# Patient Record
Sex: Male | Born: 1940 | Race: White | Hispanic: No | Marital: Married | Smoking: Current every day smoker
Health system: Southern US, Community
[De-identification: ages and names within clinical notes are randomized; demographics above are authoritative.]

## PROBLEM LIST (undated history)

## (undated) DIAGNOSIS — Z8719 Personal history of other diseases of the digestive system: Secondary | ICD-10-CM

## (undated) DIAGNOSIS — R51 Headache: Secondary | ICD-10-CM

## (undated) DIAGNOSIS — H9190 Unspecified hearing loss, unspecified ear: Secondary | ICD-10-CM

## (undated) DIAGNOSIS — I739 Peripheral vascular disease, unspecified: Secondary | ICD-10-CM

## (undated) DIAGNOSIS — I639 Cerebral infarction, unspecified: Secondary | ICD-10-CM

## (undated) DIAGNOSIS — G8929 Other chronic pain: Secondary | ICD-10-CM

## (undated) DIAGNOSIS — Z8711 Personal history of peptic ulcer disease: Secondary | ICD-10-CM

## (undated) DIAGNOSIS — I2510 Atherosclerotic heart disease of native coronary artery without angina pectoris: Secondary | ICD-10-CM

## (undated) DIAGNOSIS — R519 Headache, unspecified: Secondary | ICD-10-CM

## (undated) DIAGNOSIS — E785 Hyperlipidemia, unspecified: Secondary | ICD-10-CM

## (undated) DIAGNOSIS — S21339A Puncture wound without foreign body of unspecified front wall of thorax with penetration into thoracic cavity, initial encounter: Secondary | ICD-10-CM

## (undated) DIAGNOSIS — J4489 Other specified chronic obstructive pulmonary disease: Secondary | ICD-10-CM

## (undated) DIAGNOSIS — J189 Pneumonia, unspecified organism: Secondary | ICD-10-CM

## (undated) DIAGNOSIS — Z8709 Personal history of other diseases of the respiratory system: Secondary | ICD-10-CM

## (undated) DIAGNOSIS — C44300 Unspecified malignant neoplasm of skin of unspecified part of face: Secondary | ICD-10-CM

## (undated) DIAGNOSIS — W3400XA Accidental discharge from unspecified firearms or gun, initial encounter: Secondary | ICD-10-CM

## (undated) DIAGNOSIS — I10 Essential (primary) hypertension: Secondary | ICD-10-CM

## (undated) DIAGNOSIS — M1990 Unspecified osteoarthritis, unspecified site: Secondary | ICD-10-CM

## (undated) DIAGNOSIS — J449 Chronic obstructive pulmonary disease, unspecified: Secondary | ICD-10-CM

## (undated) DIAGNOSIS — M545 Low back pain, unspecified: Secondary | ICD-10-CM

## (undated) DIAGNOSIS — Z87442 Personal history of urinary calculi: Secondary | ICD-10-CM

## (undated) HISTORY — DX: Cerebral infarction, unspecified: I63.9

## (undated) HISTORY — DX: Headache, unspecified: R51.9

## (undated) HISTORY — DX: Headache: R51

## (undated) HISTORY — PX: KNEE ARTHROSCOPY: SHX127

## (undated) HISTORY — DX: Accidental discharge from unspecified firearms or gun, initial encounter: W34.00XA

## (undated) HISTORY — DX: Puncture wound without foreign body of unspecified front wall of thorax with penetration into thoracic cavity, initial encounter: S21.339A

## (undated) HISTORY — DX: Other chronic pain: G89.29

## (undated) HISTORY — PX: APPENDECTOMY: SHX54

## (undated) HISTORY — PX: SKIN CANCER EXCISION: SHX779

## (undated) HISTORY — DX: Unspecified hearing loss, unspecified ear: H91.90

## (undated) HISTORY — PX: SHOULDER OPEN ROTATOR CUFF REPAIR: SHX2407

## (undated) HISTORY — DX: Hyperlipidemia, unspecified: E78.5

## (undated) HISTORY — PX: PILONIDAL CYST / SINUS EXCISION: SUR543

## (undated) HISTORY — DX: Personal history of other diseases of the respiratory system: Z87.09

## (undated) HISTORY — PX: OTHER SURGICAL HISTORY: SHX169

---

## 1971-10-11 DIAGNOSIS — S21339A Puncture wound without foreign body of unspecified front wall of thorax with penetration into thoracic cavity, initial encounter: Secondary | ICD-10-CM

## 1971-10-11 HISTORY — DX: Puncture wound without foreign body of unspecified front wall of thorax with penetration into thoracic cavity, initial encounter: S21.339A

## 1999-03-17 ENCOUNTER — Other Ambulatory Visit: Admission: RE | Admit: 1999-03-17 | Discharge: 1999-03-17 | Payer: Self-pay | Admitting: Urology

## 2000-01-05 ENCOUNTER — Other Ambulatory Visit: Admission: RE | Admit: 2000-01-05 | Discharge: 2000-01-05 | Payer: Self-pay | Admitting: *Deleted

## 2000-02-02 ENCOUNTER — Encounter (INDEPENDENT_AMBULATORY_CARE_PROVIDER_SITE_OTHER): Payer: Self-pay | Admitting: *Deleted

## 2000-02-02 ENCOUNTER — Ambulatory Visit (HOSPITAL_BASED_OUTPATIENT_CLINIC_OR_DEPARTMENT_OTHER): Admission: RE | Admit: 2000-02-02 | Discharge: 2000-02-03 | Payer: Self-pay | Admitting: *Deleted

## 2001-02-27 ENCOUNTER — Ambulatory Visit (HOSPITAL_COMMUNITY): Admission: RE | Admit: 2001-02-27 | Discharge: 2001-02-27 | Payer: Self-pay | Admitting: *Deleted

## 2001-07-26 ENCOUNTER — Encounter: Payer: Self-pay | Admitting: Cardiovascular Disease

## 2001-07-26 ENCOUNTER — Ambulatory Visit (HOSPITAL_COMMUNITY): Admission: RE | Admit: 2001-07-26 | Discharge: 2001-07-27 | Payer: Self-pay | Admitting: Cardiovascular Disease

## 2001-09-13 ENCOUNTER — Encounter: Payer: Self-pay | Admitting: Emergency Medicine

## 2001-09-13 ENCOUNTER — Emergency Department (HOSPITAL_COMMUNITY): Admission: EM | Admit: 2001-09-13 | Discharge: 2001-09-13 | Payer: Self-pay | Admitting: Emergency Medicine

## 2001-11-28 ENCOUNTER — Encounter (INDEPENDENT_AMBULATORY_CARE_PROVIDER_SITE_OTHER): Payer: Self-pay | Admitting: Specialist

## 2001-11-28 ENCOUNTER — Ambulatory Visit (HOSPITAL_BASED_OUTPATIENT_CLINIC_OR_DEPARTMENT_OTHER): Admission: RE | Admit: 2001-11-28 | Discharge: 2001-11-28 | Payer: Self-pay | Admitting: Plastic Surgery

## 2002-08-28 ENCOUNTER — Inpatient Hospital Stay (HOSPITAL_COMMUNITY): Admission: EM | Admit: 2002-08-28 | Discharge: 2002-08-29 | Payer: Self-pay

## 2002-08-28 ENCOUNTER — Encounter: Payer: Self-pay | Admitting: Cardiovascular Disease

## 2002-09-30 ENCOUNTER — Emergency Department (HOSPITAL_COMMUNITY): Admission: EM | Admit: 2002-09-30 | Discharge: 2002-09-30 | Payer: Self-pay | Admitting: Emergency Medicine

## 2002-10-02 ENCOUNTER — Encounter: Payer: Self-pay | Admitting: Family Medicine

## 2002-10-02 ENCOUNTER — Encounter: Admission: RE | Admit: 2002-10-02 | Discharge: 2002-10-02 | Payer: Self-pay | Admitting: Family Medicine

## 2002-10-23 ENCOUNTER — Encounter: Admission: RE | Admit: 2002-10-23 | Discharge: 2002-10-23 | Payer: Self-pay | Admitting: *Deleted

## 2002-10-23 ENCOUNTER — Encounter: Payer: Self-pay | Admitting: *Deleted

## 2002-11-15 ENCOUNTER — Encounter: Admission: RE | Admit: 2002-11-15 | Discharge: 2002-11-15 | Payer: Self-pay | Admitting: *Deleted

## 2002-11-15 ENCOUNTER — Encounter: Payer: Self-pay | Admitting: *Deleted

## 2003-08-05 ENCOUNTER — Encounter (INDEPENDENT_AMBULATORY_CARE_PROVIDER_SITE_OTHER): Payer: Self-pay | Admitting: *Deleted

## 2003-08-05 ENCOUNTER — Ambulatory Visit (HOSPITAL_COMMUNITY): Admission: RE | Admit: 2003-08-05 | Discharge: 2003-08-05 | Payer: Self-pay | Admitting: Gastroenterology

## 2003-11-06 ENCOUNTER — Ambulatory Visit (HOSPITAL_COMMUNITY): Admission: RE | Admit: 2003-11-06 | Discharge: 2003-11-06 | Payer: Self-pay | Admitting: Gastroenterology

## 2003-11-06 ENCOUNTER — Encounter (INDEPENDENT_AMBULATORY_CARE_PROVIDER_SITE_OTHER): Payer: Self-pay | Admitting: *Deleted

## 2004-04-02 ENCOUNTER — Encounter: Admission: RE | Admit: 2004-04-02 | Discharge: 2004-04-02 | Payer: Self-pay | Admitting: Orthopedic Surgery
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
## 2004-04-05 ENCOUNTER — Ambulatory Visit (HOSPITAL_COMMUNITY): Admission: RE | Admit: 2004-04-05 | Discharge: 2004-04-05 | Payer: Self-pay | Admitting: Orthopedic Surgery

## 2004-04-05 ENCOUNTER — Ambulatory Visit (HOSPITAL_BASED_OUTPATIENT_CLINIC_OR_DEPARTMENT_OTHER): Admission: RE | Admit: 2004-04-05 | Discharge: 2004-04-05 | Payer: Self-pay | Admitting: Orthopedic Surgery

## 2004-11-29 ENCOUNTER — Ambulatory Visit (HOSPITAL_BASED_OUTPATIENT_CLINIC_OR_DEPARTMENT_OTHER): Admission: RE | Admit: 2004-11-29 | Discharge: 2004-11-29 | Payer: Self-pay | Admitting: Orthopedic Surgery

## 2005-02-14 ENCOUNTER — Encounter: Admission: RE | Admit: 2005-02-14 | Discharge: 2005-02-14 | Payer: Self-pay | Admitting: Cardiovascular Disease

## 2005-02-18 ENCOUNTER — Ambulatory Visit: Payer: Self-pay | Admitting: Pulmonary Disease

## 2005-02-21 ENCOUNTER — Ambulatory Visit (HOSPITAL_COMMUNITY): Admission: RE | Admit: 2005-02-21 | Discharge: 2005-02-22 | Payer: Self-pay | Admitting: Cardiovascular Disease

## 2005-03-04 ENCOUNTER — Ambulatory Visit: Payer: Self-pay | Admitting: Pulmonary Disease

## 2005-03-30 ENCOUNTER — Inpatient Hospital Stay (HOSPITAL_COMMUNITY): Admission: EM | Admit: 2005-03-30 | Discharge: 2005-04-02 | Payer: Self-pay | Admitting: Emergency Medicine

## 2005-08-03 ENCOUNTER — Inpatient Hospital Stay (HOSPITAL_COMMUNITY): Admission: EM | Admit: 2005-08-03 | Discharge: 2005-08-05 | Payer: Self-pay | Admitting: Emergency Medicine

## 2006-09-06 ENCOUNTER — Ambulatory Visit: Payer: Self-pay | Admitting: Pulmonary Disease

## 2006-09-29 ENCOUNTER — Ambulatory Visit: Payer: Self-pay | Admitting: Pulmonary Disease

## 2006-10-12 ENCOUNTER — Encounter: Admission: RE | Admit: 2006-10-12 | Discharge: 2006-10-12 | Payer: Self-pay | Admitting: Cardiovascular Disease

## 2006-10-19 ENCOUNTER — Ambulatory Visit (HOSPITAL_COMMUNITY): Admission: RE | Admit: 2006-10-19 | Discharge: 2006-10-19 | Payer: Self-pay | Admitting: Cardiovascular Disease

## 2007-01-09 ENCOUNTER — Ambulatory Visit: Payer: Self-pay | Admitting: Pulmonary Disease

## 2007-04-05 ENCOUNTER — Ambulatory Visit: Payer: Self-pay | Admitting: Pulmonary Disease

## 2007-05-29 ENCOUNTER — Ambulatory Visit: Payer: Self-pay | Admitting: Pulmonary Disease

## 2007-08-14 DIAGNOSIS — R51 Headache: Secondary | ICD-10-CM | POA: Insufficient documentation

## 2007-08-14 DIAGNOSIS — E785 Hyperlipidemia, unspecified: Secondary | ICD-10-CM | POA: Insufficient documentation

## 2007-08-14 DIAGNOSIS — R519 Headache, unspecified: Secondary | ICD-10-CM | POA: Insufficient documentation

## 2007-08-14 DIAGNOSIS — J329 Chronic sinusitis, unspecified: Secondary | ICD-10-CM | POA: Insufficient documentation

## 2007-08-14 DIAGNOSIS — J449 Chronic obstructive pulmonary disease, unspecified: Secondary | ICD-10-CM

## 2007-08-14 DIAGNOSIS — J209 Acute bronchitis, unspecified: Secondary | ICD-10-CM | POA: Insufficient documentation

## 2007-08-14 DIAGNOSIS — J309 Allergic rhinitis, unspecified: Secondary | ICD-10-CM | POA: Insufficient documentation

## 2007-08-14 DIAGNOSIS — H9190 Unspecified hearing loss, unspecified ear: Secondary | ICD-10-CM | POA: Insufficient documentation

## 2007-08-15 ENCOUNTER — Ambulatory Visit: Payer: Self-pay | Admitting: Internal Medicine

## 2007-09-12 ENCOUNTER — Ambulatory Visit: Payer: Self-pay | Admitting: Pulmonary Disease

## 2007-10-16 ENCOUNTER — Encounter: Payer: Self-pay | Admitting: Pulmonary Disease

## 2007-10-16 ENCOUNTER — Ambulatory Visit: Payer: Self-pay | Admitting: Internal Medicine

## 2007-10-16 DIAGNOSIS — B370 Candidal stomatitis: Secondary | ICD-10-CM

## 2007-12-03 ENCOUNTER — Ambulatory Visit (HOSPITAL_COMMUNITY): Admission: RE | Admit: 2007-12-03 | Discharge: 2007-12-03 | Payer: Self-pay | Admitting: Cardiovascular Disease

## 2008-01-11 ENCOUNTER — Ambulatory Visit: Payer: Self-pay | Admitting: Pulmonary Disease

## 2008-07-11 ENCOUNTER — Ambulatory Visit: Payer: Self-pay | Admitting: Pulmonary Disease

## 2009-03-13 ENCOUNTER — Ambulatory Visit: Payer: Self-pay | Admitting: Pulmonary Disease

## 2009-06-16 ENCOUNTER — Encounter: Admission: RE | Admit: 2009-06-16 | Discharge: 2009-06-16 | Payer: Self-pay | Admitting: Cardiovascular Disease

## 2009-06-23 ENCOUNTER — Inpatient Hospital Stay (HOSPITAL_COMMUNITY): Admission: AD | Admit: 2009-06-23 | Discharge: 2009-06-24 | Payer: Self-pay | Admitting: *Deleted

## 2009-10-12 ENCOUNTER — Telehealth (INDEPENDENT_AMBULATORY_CARE_PROVIDER_SITE_OTHER): Payer: Self-pay | Admitting: *Deleted

## 2009-11-13 ENCOUNTER — Ambulatory Visit: Payer: Self-pay | Admitting: Pulmonary Disease

## 2009-12-11 ENCOUNTER — Telehealth (INDEPENDENT_AMBULATORY_CARE_PROVIDER_SITE_OTHER): Payer: Self-pay | Admitting: *Deleted

## 2010-05-19 ENCOUNTER — Ambulatory Visit: Payer: Self-pay | Admitting: Pulmonary Disease

## 2010-05-19 DIAGNOSIS — R05 Cough: Secondary | ICD-10-CM

## 2010-10-13 ENCOUNTER — Encounter
Admission: RE | Admit: 2010-10-13 | Discharge: 2010-10-13 | Payer: Self-pay | Source: Home / Self Care | Attending: Neurosurgery | Admitting: Neurosurgery

## 2010-11-09 NOTE — Assessment & Plan Note (Signed)
Summary: acute sick visit for emphysema   Primary Provider/Referring Provider:  Vira Browns [facility])  CC:  Pt is here for a 6 month f/u appt on his emphysema.  Pt c/o increased sob with exertion and at rest.   Pt c/o mostly non-productive cough but will occ cough up yellow sputum.  Pt  states he wakes up in the mornings and it feels like he has an "elephant on his chest"   Currently smokes 1 1/2 ppd. .  History of Present Illness: the pt comes in today for his 6mos f/u, but is having ongoing issues that need to be addressed.  He has known emphysema and cardiac disease, and is c/o increased dry hacking cough as well as chest tightness with diaphoresis in the early am.  He has not taken a ntg to see if it would go away, nor has he used his rescue inhaler to see if it would help.  He does think that his breathing is not as good as earlier in the year, but wonders if it is due to the heat and humidity.  Unfortunately, he continues to smoke nearly 2 ppd.  Preventive Screening-Counseling & Management  Alcohol-Tobacco     Smoking Status: current     Smoking Cessation Counseling: yes     Packs/Day: 1.5     Tobacco Counseling: to quit use of tobacco products  Current Medications (verified): 1)  Plavix 75 Mg  Tabs (Clopidogrel Bisulfate) .... Take One Tab By Mouth Once Daily 2)  Spiriva Handihaler 18 Mcg  Caps (Tiotropium Bromide Monohydrate) .... Inhale Contents of 1 Capsule Once A Day 3)  Pantoprazole Sodium 40 Mg Tbec (Pantoprazole Sodium) .... Once Daily 4)  Symbicort 160-4.5 Mcg/act  Aero (Budesonide-Formoterol Fumarate) .... 2 Puffs Two Times A Day 5)  Proair Hfa 108 (90 Base) Mcg/act  Aers (Albuterol Sulfate) .Marland Kitchen.. 1-2 Puffs Every 4-6 Hours As Needed 6)  Metoprolol Succinate 50 Mg  Tb24 (Metoprolol Succinate) .... Take 1/2 Tabs Two Times A Day 7)  Lovaza 1 Gm  Caps (Omega-3-Acid Ethyl Esters) .... Take 2 Tabs By Mouth Daily 8)  Lisinopril 40 Mg Tabs (Lisinopril) .... Take 1 Tablet By Mouth  Once A Day 9)  Simvastatin 40 Mg Tabs (Simvastatin) .... Take 1 Tablet By Mouth Once A Day 10)  Metformin Hcl 500 Mg Tabs (Metformin Hcl) .... Take 1 Tablet By Mouth Once A Day  Allergies (verified): 1)  ! Niacin  Past History:  Past medical, surgical, family and social histories (including risk factors) reviewed, and no changes noted (except as noted below).  Past Medical History: Reviewed history from 10/16/2007 and no changes required. EMPHYSEMA (ICD-492.8) Hx of BRONCHITIS, ACUTE WITH BRONCHOSPASM (ICD-466.0) Hx of ALLERGIC RHINITIS (ICD-477.9) Hx of SINUSITIS (ICD-473.9) DECREASED HEARING (ICD-389.9) HEADACHE, CHRONIC (ICD-784.0) DYSLIPIDEMIA (ICD-272.4)  Family History: Reviewed history and no changes required.  Social History: Reviewed history from 03/13/2009 and no changes required. Patient is a current smoker.  1 1/2 ppd.  Packs/Day:  1.5  Review of Systems       The patient complains of shortness of breath with activity, shortness of breath at rest, productive cough, non-productive cough, headaches, and nasal congestion/difficulty breathing through nose.  The patient denies coughing up blood, chest pain, irregular heartbeats, acid heartburn, indigestion, loss of appetite, weight change, abdominal pain, difficulty swallowing, sore throat, tooth/dental problems, sneezing, itching, ear ache, anxiety, depression, hand/feet swelling, joint stiffness or pain, rash, change in color of mucus, and fever.    Vital Signs:  Patient profile:   70 year old male Height:      71.5 inches Weight:      255.38 pounds BMI:     35.25 O2 Sat:      97 % on Room air Temp:     97.5 degrees F oral Pulse rate:   90 / minute BP sitting:   160 / 84  (right arm) Cuff size:   large  Vitals Entered By: Arman Filter LPN (May 19, 2010 9:30 AM)  O2 Flow:  Room air CC: Pt is here for a 6 month f/u appt on his emphysema.  Pt c/o increased sob with exertion and at rest.   Pt c/o mostly  non-productive cough but will occ cough up yellow sputum.  Pt  states he wakes up in the mornings and it feels like he has an "elephant on his chest"   Currently smokes 1 1/2 ppd.  Comments Medications reviewed with patient Arman Filter LPN  May 19, 2010 9:30 AM    Physical Exam  General:  ow male in nad Nose:  no drainage or discharge Lungs:  decreased bs throughout, no wheezing or rhonchi Heart:  rrr Extremities:  minimal edema, but no cyanosis Neurologic:  alert and oriented, moves all 4.   Impression & Recommendations:  Problem # 1:  EMPHYSEMA (ICD-492.8) the pt has known emphysema and is on an aggressive bronchodilator regimen.  However, I have explained to him there are no medications which are going to offset the ongoing damage he is doing with continuing to smoke excessively.  There is nothing to suggest an acute exacerbation or pulmonary infection at this time, but it is unclear if his early am chest tightness is due to increased bronchial tone/airtrapping vs. angina.  I have asked him to take a ntg if this occurs again to see if it will resolve.  If does not, he can try his rescue inhaler.  The key for him will be smoking cessation.  Problem # 2:  COUGH (ICD-786.2) the pt has a persistent cough that is primarily dry.  He has some suggestion of postnasal drip, but also is on an ACE inhibitor.  I have asked him to try otc antihistamine to see if will help, but would also recommend a trial of his ACE to see if things improve.  Will leave that to his primary md.  Medications Added to Medication List This Visit: 1)  Lisinopril 40 Mg Tabs (Lisinopril) .... Take 1 tablet by mouth once a day 2)  Simvastatin 40 Mg Tabs (Simvastatin) .... Take 1 tablet by mouth once a day 3)  Metformin Hcl 500 Mg Tabs (Metformin hcl) .... Take 1 tablet by mouth once a day  Other Orders: Est. Patient Level IV (69629) Tobacco use cessation intermediate 3-10 minutes (52841)  Patient  Instructions: 1)  stop smoking 2)  will send a note to your primary doctor about getting you off lisinopril to see if your cough will improve. 3)  can try chlorpheniramine 8mg  at bedtime in the place of allergra for postnasal drip. 4)  continue on current inhaler regimen 5)  work on some type of exercise program. 6)  if you awaken early am with chest tightness and sweating, please take a nitroglycerin to see if goes away.  I will send a note to your primary md about this. 7)  followup with me in 6mos.

## 2010-11-09 NOTE — Assessment & Plan Note (Signed)
Summary: rov for emphysema   CC:  Follow up for refills.  states breathing is the same-no better and no worse. Pt c/o "tickle in throat" that causes him to cough during the night x83mo.  Requesting rxs for spiriva and symbicort.  Marland Kitchen  History of Present Illness: The pt comes in today for f/u of his known emphysema.  He is maintaining on symbicort and spiriva, and feels that his breathing is stable.  He has had no acute exacerbation nor recent pulmonary infection.  He has had a cough the last 3 mos which is primarily at night which is dry in nature, and from a tickle in his throat.  He has been rinsing well after using his inhalers.  Unfortunately, he is still smoking.  Current Medications (verified): 1)  Allegra 180 Mg  Tabs (Fexofenadine Hcl) .... Take Once Daily As Needed 2)  Plavix 75 Mg  Tabs (Clopidogrel Bisulfate) .... Take One Tab By Mouth Once Daily 3)  Spiriva Handihaler 18 Mcg  Caps (Tiotropium Bromide Monohydrate) .... Inhale Contents of 1 Capsule Once A Day 4)  Pantoprazole Sodium 40 Mg Tbec (Pantoprazole Sodium) .... Once Daily 5)  Symbicort 160-4.5 Mcg/act  Aero (Budesonide-Formoterol Fumarate) .... 2 Puffs Two Times A Day 6)  Proair Hfa 108 (90 Base) Mcg/act  Aers (Albuterol Sulfate) .Marland Kitchen.. 1-2 Puffs Every 4-6 Hours As Needed 7)  Metoprolol Succinate 50 Mg  Tb24 (Metoprolol Succinate) .... Take 1/2 Tabs Two Times A Day 8)  Lovaza 1 Gm  Caps (Omega-3-Acid Ethyl Esters) .... Take 2 Tabs By Mouth Daily  Allergies (verified): 1)  ! Niacin  Review of Systems      See HPI  Vital Signs:  Patient profile:   70 year old male Height:      71.5 inches Weight:      249.25 pounds BMI:     34.40 O2 Sat:      96 % on Room air Temp:     97.9 degrees F oral Pulse rate:   66 / minute BP sitting:   136 / 74  (left arm) Cuff size:   large  Vitals Entered By: Gweneth Dimitri RN (November 13, 2009 10:54 AM)  O2 Flow:  Room air CC: Follow up for refills.  states breathing is the same-no  better, no worse. Pt c/o "tickle in throat" that causes him to cough during the night x92mo.  Requesting rxs for spiriva and symbicort.   Comments Medications reviewed with patient Daytime contact number verified with patient. Gweneth Dimitri RN  November 13, 2009 10:54 AM     Physical Exam  General:  ow male in nad Lungs:  decreased bs throughout, no wheezing or rhonchi very mild bibasilar crackles. Heart:  rrr Extremities:  no significant edema, no cyanosis Neurologic:  alert and oriented, moves all 4.   Impression & Recommendations:  Problem # 1:  EMPHYSEMA (ICD-492.8) The pt is maintaining a reasonable baseline, but I think he could do so much better if he could quit smoking and work on conditioning/weight loss.  I have told him the first treatment of a cough in smokers is smoking cessation.  He is to stay on the same medications, and let me know if his cough continues to be a problem.  I would check a cxr if the cough continues.  Medications Added to Medication List This Visit: 1)  Pantoprazole Sodium 40 Mg Tbec (Pantoprazole sodium) .... Once daily  Other Orders: Est. Patient Level II (17616)  Patient Instructions: 1)  no change in meds 2)  work on quitting smoking 3)  followup with me in 6mos  Prescriptions: PROAIR HFA 108 (90 BASE) MCG/ACT  AERS (ALBUTEROL SULFATE) 1-2 puffs every 4-6 hours as needed  #1 x 6   Entered and Authorized by:   Barbaraann Share MD   Signed by:   Barbaraann Share MD on 11/13/2009   Method used:   Print then Give to Patient   RxID:   [id number] SYMBICORT 160-4.5 MCG/ACT  AERO (BUDESONIDE-FORMOTEROL FUMARATE) 2 puffs two times a day  #1 x 6   Entered and Authorized by:   Barbaraann Share MD   Signed by:   Barbaraann Share MD on 11/13/2009   Method used:   Print then Give to Patient   RxID:   [id number] SPIRIVA HANDIHALER 18 MCG  CAPS (TIOTROPIUM BROMIDE MONOHYDRATE) Inhale contents of 1 capsule once a day  #30 Each x 6   Entered and  Authorized by:   Barbaraann Share MD   Signed by:   Barbaraann Share MD on 11/13/2009   Method used:   Print then Give to Patient   RxID:   [id number]    Immunization History:  Influenza Immunization History:    Influenza:  historical (07/10/2009)   Appended Document: rov for emphysema megan, please call this pt and remind him to call us if his cough continues, and we will need to check a cxr if he has not had one recently.  thanks.  Appended Document: rov for emphysema called and spoke with pt.  pt states his last cxr was Sept 2010.  Also informed pt to call us if cough continues and/or doesn't improve.  pt verbalized understanding.

## 2010-11-09 NOTE — Progress Notes (Signed)
Summary: prescript  Phone Note Call from Patient   Caller: Patient Call For: clance Summary of Call: pt want to know why spiriva inhaler prescript was denied? Initial call taken by: Rickard Patience,  October 12, 2009 11:17 AM  Follow-up for Phone Call        rx refilled spoke with pt scheduled appt 11/13/09 pt unable to pay copay until then, understands rx will not be refilled if February appt is not kept. Follow-up by: Jerolyn Shin,  October 12, 2009 12:13 PM    Prescriptions: SPIRIVA HANDIHALER 18 MCG  CAPS (TIOTROPIUM BROMIDE MONOHYDRATE) Inhale contents of 1 capsule once a day  #30 Each x 0   Entered by:   Jerolyn Shin   Authorized by:   Barbaraann Share MD   Signed by:   Jerolyn Shin on 10/12/2009   Method used:   Electronically to        [facility] (retail)       [address]       [place], [place]  [address]       Ph: [phone]       Fax: [phone]   RxID:   [id number]

## 2010-11-09 NOTE — Progress Notes (Signed)
Summary: prescript  Phone Note Call from Patient   Caller: Spouse linda Call For: clance Summary of Call: need refill for spiriva [facility] [place] Initial call taken by: Rickard Patience,  December 11, 2009 8:34 AM  Follow-up for Phone Call        called, spoke with pt's wife Bonita Quin.  Informed her pt was given a rx for spirva at last ov on 11/13/09 with [facility].  Per Bonita Quin, [facility] but when he went to pick up rx he was told by [facility] that they did not have the rx.  [facility]ed[facility].  Spoke with [employer].  Per Ashlyn, pt last filled Spiriva on Feb 2 and had no more rx left and they do not have another rx for Spiriva.  Will resend rx electronically to [facility].  Pt's wife Bonita Quin aware.    Prescriptions: SPIRIVA HANDIHALER 18 MCG  CAPS (TIOTROPIUM BROMIDE MONOHYDRATE) Inhale contents of 1 capsule once a day  #30 Each x 6   Entered by:   Gweneth Dimitri RN   Authorized by:   Barbaraann Share MD   Signed by:   Gweneth Dimitri RN on 12/11/2009   Method used:   Electronically to        [facility] (retail)       [address]       [place], [place]  [address]       Ph: [phone]       Fax: [phone]   RxID:   [id number]

## 2010-11-15 ENCOUNTER — Ambulatory Visit (INDEPENDENT_AMBULATORY_CARE_PROVIDER_SITE_OTHER): Payer: Self-pay | Admitting: Pulmonary Disease

## 2010-11-15 ENCOUNTER — Encounter: Payer: Self-pay | Admitting: Pulmonary Disease

## 2010-11-15 DIAGNOSIS — J438 Other emphysema: Secondary | ICD-10-CM

## 2010-12-01 NOTE — Assessment & Plan Note (Signed)
Summary: rov for emphysema   Primary Provider/Referring Provider:  Vira Browns [facility])  CC:  6 month f/u appt on Emphysema.  Pt states his wife recently passed away.  Pt is currently smoking 1 1/2 ppd.  Pt states he has had difficulty breathing over the past several months and has been treated by pcp and ER with prednisone and abx.  Today pt c/o increased sob with exertion and coughing up gray sputum. Marland Kitchen  History of Present Illness: the pt comes in today for f/u of his known emphysema.  He has had worsening sob over the last few months, and has required abx and steroid tapers for what sounds like episodes of acute AB.  He has been smoking more than usual since the death of his wife, and this is most likely the cause.  He currently is not congested, and is not bringing up purulent mucus.  He is still a little more sob than usual.  Preventive Screening-Counseling & Management  Alcohol-Tobacco     Smoking Status: current     Smoking Cessation Counseling: yes     Packs/Day: 1.5     Tobacco Counseling: to quit use of tobacco products  Current Medications (verified): 1)  Plavix 75 Mg  Tabs (Clopidogrel Bisulfate) .... Take One Tab By Mouth Once Daily 2)  Spiriva Handihaler 18 Mcg  Caps (Tiotropium Bromide Monohydrate) .... Inhale Contents of 1 Capsule Once A Day 3)  Pantoprazole Sodium 40 Mg Tbec (Pantoprazole Sodium) .... Once Daily 4)  Symbicort 160-4.5 Mcg/act  Aero (Budesonide-Formoterol Fumarate) .... 2 Puffs Two Times A Day 5)  Proair Hfa 108 (90 Base) Mcg/act  Aers (Albuterol Sulfate) .Marland Kitchen.. 1-2 Puffs Every 4-6 Hours As Needed 6)  Metoprolol Succinate 50 Mg  Tb24 (Metoprolol Succinate) .... Take 1/2 Tabs Two Times A Day 7)  Lovaza 1 Gm  Caps (Omega-3-Acid Ethyl Esters) .... Take 2 Tabs By Mouth Daily 8)  Simvastatin 40 Mg Tabs (Simvastatin) .... Take 1 Tablet By Mouth Once A Day 9)  Metformin Hcl 500 Mg Tabs (Metformin Hcl) .... Take 1 Tablet By Mouth Once A Day  Allergies  (verified): 1)  ! Niacin  Social History: Patient is a current smoker.  1 1/2 ppd.  pt is widowed.  Review of Systems       The patient complains of shortness of breath with activity, productive cough, irregular heartbeats, loss of appetite, weight change, difficulty swallowing, nasal congestion/difficulty breathing through nose, sneezing, itching, anxiety, depression, and joint stiffness or pain.  The patient denies shortness of breath at rest, non-productive cough, coughing up blood, chest pain, acid heartburn, indigestion, abdominal pain, sore throat, tooth/dental problems, headaches, ear ache, hand/feet swelling, rash, change in color of mucus, and fever.    Vital Signs:  Patient profile:   70 year old male Height:      71.5 inches Weight:      258.38 pounds BMI:     35.66 O2 Sat:      96 % on Room air Temp:     97.8 degrees F oral Pulse rate:   65 / minute BP sitting:   136 / 62  (left arm) Cuff size:   large  Vitals Entered By: Arman Filter LPN (November 15, 2010 9:46 AM)  O2 Flow:  Room air CC: 6 month f/u appt on Emphysema.  Pt states his wife recently passed away.  Pt is currently smoking 1 1/2 ppd.  Pt states he has had difficulty breathing over the past several  months and has been treated by pcp and ER with prednisone and abx.  Today pt c/o increased sob with exertion and coughing up gray sputum.  Comments Medications reviewed with patient Arman Filter LPN  November 15, 2010 9:46 AM    Physical Exam  General:  ow male in nad Nose:  no purulence or discharge noted. Lungs:  decreased bs, a few rhonchi, no wheezing Heart:  rrr Extremities:  no edema or cyanosis  Neurologic:  alert and oriented, moves all 4.   Impression & Recommendations:  Problem # 1:  EMPHYSEMA (ICD-492.8) the pt has known emphysema, and most recently has had a few acute exacerbations requiring prednisone.  His wife recently died, and he has been smoking more than usual due to stress.  He has  rhonchi today, but no wheezing.  Will continue on his same bronchodilator regimen, but will also get him a neb machine to use for emergencies.  He understands there are no meds to keep him well if he continues to smoke.  Medications Added to Medication List This Visit: 1)  Albuterol Sulfate (2.5 Mg/73ml) 0.083% Nebu (Albuterol sulfate) .Marland Kitchen.. 1 vial in nebulizer every 6 hours as needed only  Other Orders: Est. Patient Level III ([address]) DME Referral (DME) Tobacco use cessation intermediate 3-10 minutes (91478)  Patient Instructions: 1)  stay on symbicort and spiriva 2)  will get you a neb machine with albuterol to use only for emergencies or when you are having a bad day.  Can use every 6 hrs if needed. 3)  stop smoking.  this is the only way you can stay well. 4)  followup with me in 6mos.   Prescriptions: ALBUTEROL SULFATE (2.5 MG/3ML) 0.083% NEBU (ALBUTEROL SULFATE) 1 vial in nebulizer every 6 hours as needed only  #120 x 6   Entered by:   Arman Filter LPN   Authorized by:   Barbaraann Share MD   Signed by:   Arman Filter LPN on 29/56/2130   Method used:   Printed then faxed to ...       [facility] [address] (retail)       [address]       [place], [place]  [address]       Ph: [phone]       Fax: [phone]   RxID:   [id number]    Immunization History:  Influenza Immunization History:    Influenza:  historical (06/10/2010)

## 2011-01-11 ENCOUNTER — Other Ambulatory Visit: Payer: Self-pay | Admitting: Pulmonary Disease

## 2011-01-14 LAB — GLUCOSE, CAPILLARY: Glucose-Capillary: 112 mg/dL — ABNORMAL HIGH (ref 70–99)

## 2011-01-14 LAB — CBC
HCT: 41.8 % (ref 39.0–52.0)
Hemoglobin: 14.4 g/dL (ref 13.0–17.0)
MCHC: 34.4 g/dL (ref 30.0–36.0)
MCV: 96.5 fL (ref 78.0–100.0)
Platelets: 197 10*3/uL (ref 150–400)
RBC: 4.33 MIL/uL (ref 4.22–5.81)
WBC: 6.4 10*3/uL (ref 4.0–10.5)

## 2011-01-14 LAB — BASIC METABOLIC PANEL
Chloride: 107 mEq/L (ref 96–112)
Creatinine, Ser: 0.98 mg/dL (ref 0.4–1.5)
GFR calc Af Amer: >60 mL/min (ref >60)
Glucose, Bld: 120 mg/dL — ABNORMAL HIGH (ref 70–99)

## 2011-02-22 NOTE — Cardiovascular Report (Signed)
NAME:  AVENIR, LOZINSKI NO.:  [id number]   MEDICAL RECORD NO.:  [id number]          PATIENT TYPE:  AMB   LOCATION:  SDS                          FACILITY:  [facility]   PHYSICIAN:  Nanetta Batty, M.D.   DATE OF BIRTH:  07-22-1941   DATE OF PROCEDURE:  DATE OF DISCHARGE:                            CARDIAC CATHETERIZATION   ABDOMINAL AORTOGRAM/PT AND STENT PROCEDURE   Mr. Worley is a 70 year old mildly overweight white male, a history of CAD  and PVOD.  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  His other problems include  hypertension, hyperlipidemia, COPD with continued tobacco abuse, GERD  and erectile dysfunction.  He is a small abdominal aortic aneurysm  measuring 3 x 3 cm.  I stented his left common iliac and restented him  October 19, 2006 for in-stent restenosis.  He has had recurrent  claudication with Doppler suggesting high-frequency signal in his left  common iliac.  Presents now for angiography and potential intervention.   DESCRIPTION OF PROCEDURE:  The patient brought to the second floor [facility]  of PV angiographic suite, in the postabsorptive state.  He was not  premedicated with p.o. Valium.  His left groin was prepped and shaved in  the usual sterile fashion.  One percent Xylocaine was used for local  anesthesia.  A 5-French sheath was inserted into the left femoral  artery, using standard Seldinger technique..  A 5-French short tennis-  racket catheter was used for distal abdominal aortography.  Visipaque  dye was used entirety of the case.  Retrograde aortic pressures were  monitored during the case.   ANGIOGRAPHIC RESULTS:  1. Abdominal aorta.      a.     Small distal abdominal aortic aneurysm.  2. Left lower extremity;      a.     A 50% to 60% in-stent restenosis within the proximal left       common iliac artery stent with 50-mm pullback gradient noted after       administration of 200 mcg of  intra-arterial nitroglycerin with       venous side-arm sheath.  3. Right lower extremity.      a.     Right iliac widely patent.   DESCRIPTION OF PROCEDURE:  The patient received 2500 units of heparin  intravenously.  This short 5-French sheath was exchanged over 0.035  Wholey wire for a 30 cm along  6-French Cordis sheath.  PTA was  performed with a 9.2 Powerflex nominal pressures which resulted in an  acceptable angiographic result but with residual pullback gradient.  Because of this, this was re-stented with a 10 x 3 Protege E3 nitinol  self-expanding stent and postdilated with a 9.2 Powerflex, resulting in  reduction of 60% in-stent restenosis to 0% residual.  The patient  tolerated the procedure well.  ACT was measured at less than 200.  Sheath was removed, and pressure was held to  the groin to achieve hemostasis.  The patient left the lab in stable  condition.  He  will remain recumbent for 6 hours, afterwards should be  discharged home as an outpatient.  He will have follow-up Dopplers and  ABIs and will see me back in the office for followup.      Nanetta Batty, M.D.  Electronically Signed     JB/MEDQ  D:  12/03/2007  T:  12/03/2007  Job:  [id number]   cc:   Patient Chart  2nd Floor [facility] Angiographic Ste  [facility]  [place], [place] Vira Browns, M.D. [facility]

## 2011-02-22 NOTE — Assessment & Plan Note (Signed)
[facility]                             PULMONARY OFFICE NOTE   NAME:[name], [name] COLLERAN                           MRN:          [id number]  DATE:08/15/2007                            DOB:          [birth year]    HISTORY:  This is a 70 year old white male active smoker who is  characterized as refractory chronic asthmatic bronchitis based on active  smoking who comes in today with acute-on-chronic complaints of cough  productive of yellow sputum, chest tightness, subjective wheezing on a  regimen that consists already of Symbicort 80/4.5 2 puffs b.i.d. and  Spiriva daily.  He has only minimum discoloration of sputum with no  history of pleuritic or exertional chest pain, orthopnea, PND, or leg  swelling.   PHYSICAL EXAMINATION:  GENERAL:  He is a pleasant ambulatory obese white  male in no acute distress.  VITAL SIGNS:  He has stable vital signs.  HEENT:  He has no upper teeth.  Lower dentition is intact.  Oropharynx  is clear.  NECK:  Supple without cervical adenopathy or tenderness.  Trachea is  midline, no thyromegaly.  LUNGS:  Lung fields reveal a few rhonchi bilaterally with end-expiratory  wheeze/cough.  CARDIAC:  Regular rate and rhythm without murmurs, rubs, or gallops.  ABDOMEN:  Soft, benign.  EXTREMITIES:  Warm without calf tenderness, cyanosis, or clubbing.   Chest x-ray was recommended.   MDI technique was reviewed and 25% at baseline and 75% with coaching.   IMPRESSION:  Acute-on-chronic cough, certainly consistent with chronic  asthmatic bronchitis related to continued smoking against medical  advice.  I recommended an increased dose of Symbicort to 160/4.5 two  puffs b.i.d. and made sure that he could inhale it effectively.  However, I emphasized to him, as Dr. Shelle Iron has in the past, that this  is not an antidote to smoking and that without smoking cessation it is  unlikely that he his going to see significant benefit  symptomatically.   I did review with him a reflux regimen, noting that he is already taking  Nexium but has very little insight into diet or pathophysiology.  I have  asked him to stop fish oil and all menthol products and return to see  Dr. Shelle Iron within 4-6 weeks with PFTs.   He received a Pneumovax today and dose receive the flu vaccination  yearly.   To treatment him acutely today, I also recommended doxycycline 100 mg  tablets 1 b.i.d. for 7 days and prednisone 10 mg tablets, #14, to be  tapered off over 6 days.     [name]. Sherene Sires, MD, [facility]  Electronically Signed    MBW/MedQ  DD: 08/15/2007  DT: 08/16/2007  Job #: [id number]   cc:   Vira Browns, M.D.

## 2011-02-25 NOTE — Cardiovascular Report (Signed)
NAMEVANE, YAPP NO.:  [id number]   MEDICAL RECORD NO.:  [id number]          PATIENT TYPE:  OIB   LOCATION:  6527                         FACILITY:  [facility]   PHYSICIAN:  Nanetta Batty, M.D.   DATE OF BIRTH:  Jun 08, 1941   DATE OF PROCEDURE:  02/21/2005  DATE OF DISCHARGE:                              CARDIAC CATHETERIZATION   PROCEDURE:  1.  Peripheral angiogram  2.  Percutaneous transluminal angioplasty and stenting.   Mr. Riese is a 70 year old white male with history of noncritical CAD by  catheterization May 2002.  He has peripheral vascular occlusive disease  status post left common iliac artery PTA and stenting in 2002 with  improvement in his claudication symptoms.  He also has a small abdominal  aortic aneurysm, moderate left ICA stenosis by duplex ultrasound.  He is  complaining of progressive claudication, and ABIs showed a high-frequency  jet across the ostium of his left common iliac artery.  He presents now for  angiography and potential intervention.   PROCEDURE DESCRIPTION:  The patient was brought to the sixth floor [facility] Suite in the postabsorptive state.  He  was premedicated with p.o. Valium.  His left groin was prepped and shaved in  the usual sterile fashion; 1% Xylocaine was used for local anesthesia.  A 6-  French sheath was inserted into the left femoral artery using standard  Seldinger technique.  A 6-French tennis racket catheter as well as end-hole  catheter were used for measurement and distal abdominal aortography  bifemoral runoff.  Visipaque dye was used for the entirety of the case.  Retrograde aortic pressure was monitored during the case.  A pullback rate  was obtained across the ostium of the left common iliac artery with a 5-  [place] end-hold catheter after administration of 200 mcg of intraarterial  nitroglycerin through the side arm sheath.   ANGIOGRAPHIC RESULTS:  1.  Abdominal  artery.      1.  Renal: Arteries normal      2.  Infrarenal abdominal aorta: Small fusiform abdominal aortic aneurysm          terminating above the iliac bifurcation.  2.  Left lower extremity:      1.  50% ostial left common iliac artery stenosis with a 40 mm pullback          with an end-hold catheter after administration of intraarterial          nitroglycerin.      2.  40% segmental mid left superficial femoral artery with two vessel          runoff.  3.  Right lower extremity: Normal.   PROCEDURE DESCRIPTION:  The patient received 2000 units of heparin  intravenously.  A short 6-French sheath was exchanged for a 30 cm long break  tip quarter sheath.  The [facility] wire was advanced across the lesion, and an  8 x 18 Genesis __________ was then deployed under angiographic fluoroscopic  control at the ostium of the left common iliac artery at 12  atmospheres.  This was then post dilated with a 9-2 Powerflex at 10 atmospheres resulting  in reduction of 50% ostial left common iliac artery stenosis to 0% residual.  The patient tolerated the procedure well.  The sheath was removed, and the  groin was sealed with a Star closure device resulting in excellent  hemostasis.  The  patient left the lab in stable condition.  He will receive 1 g of Rocephin  prophylactically and hydrated.  Blood work will be obtained in the morning  after which time he will be discharged home.  We will obtain lower extremity  Dopplers and ABIs and will see him back in the office in followup.      JB/MEDQ  D:  02/21/2005  T:  02/21/2005  Job:  [id number]   cc:   6th Floor [facility] Peripheral Angiographic Ste   [facility]  [address]  [place], [place] [address]   Vira Browns

## 2011-02-25 NOTE — Op Note (Signed)
[place]  Patient:    [name], [name] Visit Number: [id number] MRN: [id number]          Service Type: EMS Location: [place] Attending Physician:  Osvaldo Human Dictated by:   Consuello Bossier., M.D. Proc. 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  PROCEDURE:  For further excisional biopsy and primary closure.  SURGEON:  Consuello Bossier., M.D.  ANESTHESIA:  Xylocaine 2% with epinephrine, 1:100,000.  FINDINGS:  The patient had a previous basal cell carcinoma which was biopsied on several occasions of his left helical rim, just posterior to the edge of the rim for which the above surgical procedure was carried out.  DESCRIPTION OF PROCEDURE:  The patient was brought to the operating room and marked off for the planned elliptical excision of the previously biopsied area of his left upper helical rim.  He was prepped with Betadine and draped sterilely.  He was anesthetized with Xylocaine 2% with epinephrine, 1:100,000 anesthesia.  Following the onset of anesthesia, the elliptical excisional biopsy was made as a full thickness biopsy.  The skin closed with interrupted running #6-0 Prolene.  Neosporin ointment and light compressive dressing were applied.  The patient tolerated the procedure well and will return in one week for suture removal, before with any problems. Dictated by:   Consuello Bossier., M.D. Attending Physician:  Osvaldo Human DD:  11/28/01 TD:  11/29/01 Job: [id number]

## 2011-02-25 NOTE — Cardiovascular Report (Signed)
[name], [name] NO.:  [id number]   MEDICAL RECORD NO.:  [id number]          PATIENT TYPE:  AMB   LOCATION:  SDS                          FACILITY:  [facility]   PHYSICIAN:  Nanetta Batty, M.D.   DATE OF BIRTH:  1940/11/29   DATE OF PROCEDURE:  10/19/2006  DATE OF DISCHARGE:                            CARDIAC CATHETERIZATION   HISTORY:  [name] is a 70 year old moderately overweight white male with  a history of CAD, status post left common iliac artery, PTN stenting  with restenting 02/21/2005.  She had complained of recurrent left lower  extremity claudication.  Follow-up Dopplers revealed a high frequency  signal on the origin of the left common iliac artery.  Other problems  include CAD status post intervention in the past, hypertension,  hyperlipidemia, COPD with continued tobacco abuse, and GERD.  He  presents now for angiography and potential intervention.   PROCEDURE DESCRIPTION:  The patient brought to the second floor [facility]  PV angiographic suite in the postabsorptive state.  He was premedicated  with p.o. Valium.  His left groin was prepped and shaved in the usual  sterile fashion.  Then 1% Xylocaine was used for local anesthesia.  A 5-  French sheath was inserted into the left femoral artery using standard  Seldinger technique.  A 5-French __________  catheter was used for  midstream and distal abdominal aortography with bifemoral runoff.  Visipaque dye was used entirety of the case.  Aortic pressures were  monitored throughout the case.   ANGIOGRAPHIC RESULTS:  1. Abdominal aorta.      a.     Renal artery -- __________      b.     Infrarenal abdominal aorta--small fusiform abdominal aortic       aneurysm extending to the iliac bifurcation.  2. Left lower extremity;      a.     Approximate 50% InStent restenosis within the left common       iliac artery stent.      b.     A 40% segmental mid left SFA with 3-vessel runoff.  3. Right lower  extremity; normal 3-vessel runoff.   IMPRESSION:  [name] has a 50% InStent restenosis in the left common  iliac artery stent.  A pullback gradient performed with a 5-French end-  hole catheter after administration of 200 mcg intra-arterial  nitroglycerin, revealing a gradient of 40 mmHg.  Because of this, it was  deemed physiologically significant; and we will proceed with PTN and  restenting for InStent restenosis.Marland Kitchen   PROCEDURE DESCRIPTION:  The patient received 2500 units of heparin  intravenously.  The existing 5-French sheath was exchanged over the 035  Wholey wire for a 6-French 35-cm long break-tip Cordis sheath.  Using a  Boston scientific 8 x 27 Express peripheral balloon stent premount;  stenting was performed with delivery of the stent at 10 atmospheres.  This was then placed replaced __________  with a 9 x 2 Powerflex  __________  atmospheres resulting in reduction of 50% InStent  restenosis to 0% residual.  The patient tolerated the procedure well.  The ACT at the end of the case was 160.  The sheath was removed; and  pressure was held on the groin to  achieve hemostasis.  The patient left the lab in stable condition.  Plans will be for hydration over the next 5 hours, at which time the  patient can ambulate; and be discharged home.  He will get follow up  Dopplers and ABIs at which time I will see him back in the office for  clinical followup.  He left the lab in stable condition.      Nanetta Batty, M.D.  Electronically Signed     JB/MEDQ  D:  10/19/2006  T:  10/19/2006  Job:  [id number]   cc:   Second Floor [facility]  [facility]  [name]

## 2011-02-25 NOTE — Discharge Summary (Signed)
[place]  Patient:    ZYEIR, DYMEK Visit Number: [id number] MRN: [id number]          Service Type: DSU Location: 3700 3730 [id number] Attending Physician:  Berry, Jonathan [place] Dictated by:   Halford Decamp Delanna Ahmadi, R.N., N.P. Admit Date:  07/26/2001 Discharge Date: 07/27/2001   CC:         Roxy Manns, M.D. [facility]   Discharge Summary  HOSPITAL COURSE:  Mr. Raylon Lamson is a 70 year old white married male patient of Dr. Nanetta Batty, who came into the hospital for an outpatient PV angiogram secondary to claudication symptoms and abnormal ABIs.  He underwent a PTA of his left common iliac artery for 80% eccentric proximal stenosis.  He had a stent placed, reducing from 80% to 0%.  The following morning, he was seen by Dr. Susa Griffins and was considered stable to be discharged to home.  His right groin was without any significant bruise or hematoma.  His labs showed his hemoglobin to be 14.8, hematocrit 43.5, platelets 211, WBC 7.5.  His sodium was 139, potassium 4.3, BUN 14, creatinine 1.0, and glucose 97.  DISCHARGE MEDICATIONS: 1. Serevent and Albuterol inhalers as taken previously. 2. Welchol six pills per day with a meal. 3. Enteric-coated aspirin 325 mg once per day. 4. Flomax 0.4 mg twice per day. 5. Plavix 75 mg one per day.  ACTIVITY:  He should do no strenuous activity, no lifting, and no driving x 4 days.  DIET:  He should be on a low saturated fat diet.  FOLLOWUP:  He will be followed up with lower extremity Dopplers on August 10, 2001, at 5 p.m. in our office and follow up with Dr. Allyson Sabal on August 14, 2001, at 10:30 a.m.  DISCHARGE DIAGNOSES: 1. Atherosclerotic peripheral vascular disease, status post peripheral    vascular angiogram with a percutaneous transluminal angiography to his    left common iliac, reduced from 80% to 0% with stenting. 2. Abdominal aortic aneurysm, which is known.  He has been seen by    Dr. Hart Rochester and it  was sized at 2.94 and not recommended for intervention    at this time. 3. Carotid stenosis was also evaluated in the past by Dr. Hart Rochester in June    and recommended not to have any intervention at this time and to watch it    on a yearly basis. 4. Coronary artery disease, status post cardiac catheterization in the past    by Dr. 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 5. Normal ejection fraction. 6. Dyslipidemia. 7. Benign prostatic hypertrophy. Dictated by:   Halford Decamp Delanna Ahmadi, R.N., N.P. Attending Physician:  Berry, Jonathan [place] DD:  07/27/01 TD:  07/29/01 Job: [id number]

## 2011-02-25 NOTE — Cardiovascular Report (Signed)
[name], [name] NO.:  [id number]   MEDICAL RECORD NO.:  [id number]          PATIENT TYPE:  INP   LOCATION:  3728                         FACILITY:  [facility]   PHYSICIAN:  Richard A. Alanda Amass, M.D.DATE OF BIRTH:  [birth year]   DATE OF PROCEDURE:  03/31/2005  DATE OF DISCHARGE:                              CARDIAC CATHETERIZATION   PROCEDURE:  Retrograde central aortic catheterization, selective coronary  angiography pre- and post __________, left __________ left circumflex  injection, subselective __________, LV angiogram in RAO and LAO projection.  __________.  A weight adjusted heparin __________ , heparin 5500 units,  Pepcid 40 IV,  Aggrastat double bolus infusion (25 mg bolus), PTCA  predilatation __________ TAXUS 2.5/20 stent high grade proximal segmental  anomalous circumflex stenosis.   PROCEDURE:  The patient was brought to the second floor CP lab in a post  absorptive state after 5 mg Valium p.o. premedication. The right groin was  prepped, draped in usual manner. We used 1% Xylocaine for local anesthesia.  The RCFA was entered with single anterior puncture using 18 thin wall needle  and a 6-French short Daig sidearm sheath was inserted without difficulty.  Diagnostic coronary angiography was done with 6-French 4 cm taper Cordis  preformed coronary and pigtail catheters. The anomalous circumflex artery  was seen off the very proximal large right coronary artery on subselective  injection. Subselective LMA and RMA was done with the right coronary  catheter demonstrating no significant subclavian or brachiocephalic  stenosis, tortuous brachiocephalic. There was 70 to 80% concentric left  vertebral proximal stenosis with good flow.  There was good anterior right  vertebral flow. The IMAs were both widely patent.   LV angiogram was done the RAO and LAO projection at 25 mL 14 mL per second  20 mL 12 mL per second. Pullback pressure CA was performed and  showed no  gradient across the aortic valve. Abdominal aortic angiogram was done above  the level of the renal arteries at 25 mL 20 mL  per second with  visualization to the distal iliacs bilaterally. This demonstrated single  renal arteries bilaterally with no significant stenosis. There was moderate  infrarenal atherosclerotic disease. There was a moderate sized infrarenal  fusiform aneurysm ending at the iliac bifurcation and beginning in the mid  infrarenal abdominal aorta with a very long infrarenal neck. The proximal  iliacs had no significant stenosis. The left common iliac stent was widely  patent.  The hypogastrics were intact. There was no significant external  iliac stenosis. There appeared to be good run off bilaterally.   LV angiogram demonstrated hypo- akinesis of the posterior apical segment,  otherwise good wall motion abnormality. EF greater than or equal to 55%.  Mild angiographic LVH was present.   Pressures: LV: 140/0; LVEDP 18 - 20 mmHg.   CA: 140/70 mmHg.   Fluoroscopy showed 2+ calcification of the proximal left coronary and right  coronary system. There was mild mitral annular calcification and no other  significant valvular calcification seen.   The main left coronary artery was essentially an  LAD since the circumflex  was anomalous. There was no significant ostial or proximal stenosis. The  first vessel was a moderately large trifurcating DX I that had  irregularities without significant stenosis. This was followed by a small DX  I with no significant stenosis, then a large trifurcating septal perforator  branch. There was then a large near twin DX III arising just before SP2,  had no significant stenosis. Just beyond this, there was 85% concentric  stenosis mildly segmental of the LAD. The remainder of the LAD had  irregularities but no significant stenosis, gave off a septal distally and a  small fourth diagonal from the distal third.   The right  coronary was a large dominant vessel. It had lumpy bumpy  irregularities throughout. There was 30-40% proximal segmental narrowing 40%  mid and 50% narrowing of the mid posterolateral branch. There were two large  PDA branches that had no significant stenosis. There was good flow  throughout.   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 There was segmental disease with high-  grade focal 95% tandem stenosis in the mid portion and 80% segmental disease  up to the proximal bend. It was predominantly a very large bifurcating  marginal branch comprising the anomalous circumflex. The patient did have  chest discomfort with subselective circumflex injection relieved after this.   INTRODUCTION-YEAR-OLD:  [name] is a 70 year old white married father of two  with two grandchildren. He is a smoker, hypertension, hyperlipidemia,  peripheral arterial disease. He has had prior catheterization Feb 27, 2001,  by Dr. 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 Unfortunately, he continued to smoke. He was seen by Dr. Allyson Sabal,  had PV angiography May of 2006 and had left common iliac stenting. He has  asymptomatic moderate internal carotid stenosis which is being monitored. He  had a negative Cardiolite for ischemia May 2005. The patient recently came  back one week ago from a trip to [place] that he went on his own and did  quite well during this time without any angina. Over the three days prior to  admission, he has had intermittent substernal chest pain radiating to the  right upper extremity compatible with ischemia. A prolonged episode of chest  pain prompting admission occurred on March 30, 2005. He had no acute EKG changes and has had elevated troponin with normal CK and MBs and no  recurrent chest pain on nitroglycerin, heparin,  aspirin and Plavix since  admission.   Catheterization demonstrates high grade segmental progression of disease in  the anomalous circumflex artery which supplies a large marginal and atrial  branch. He has an 85% LAD lesion in the junction of the proximal third  beyond the large third diagonal and noncritical dominant right coronary  disease with diffuse lumpy bumpy irregularity and minor narrowing  throughout.   It was felt that the patient's culprit lesion was probably the anomalous  circumflex, so we initially felt that intervention on this would be suitable  at present. Informed consent was obtained the patient was given double bolus  Aggrastat plus infusion given 300 mg extra Plavix and weight adjusted  heparin of 5500 units monitoring ACTs. The right coronary was intubated with  a Scimed MP1 side hole guiding catheter subselectively. The lesion was wired  with a 0.014 inch Asahi soft wire. This required  a 2.0/15 Maverick Scimed  balloon backup and we were then able to cross the high grade tandem stenosis  of the anomalous circumflex and maintained guide support. IC nitroglycerin  was administered. The lesion was then dilated with a 2.0/15 Maverick balloon  at 8-28 and 8-35. The balloon was exchanged for a TAXUS DES 2.5/20 stent. We  positioned this to cover the lesion and it came up close to the ostia. The  stent was deployed at 13 - 36 and post dilated 16 - 44. The balloon was  removed and final injection showed excellent angiographic result with  stenosis reduction of 95% to zero, smooth fully deployed stent with a large  bifurcating distal marginal and a moderate size proximal marginal. The RAO  projection showed that there was probably 30% residual smooth narrowing in  the ostium of the anomalous circumflex. We did not want to stent up to the  right so as to not compromise the large dominant right with any stent  sticking out. This was felt to be an excellent angiographic  result with good  flow. The dilatation system was removed. Final ACT was 273 seconds. Side-arm  sheath was flushed and the patient was brought to the holding area for  postoperative care in stable condition.  Will continue Aggrastat for 18-24  hours, aspirin, Plavix medical therapy. We plan to consider bringing him  back for staged PCI of his high-grade LAD stenosis.   CATHETERIZATION DIAGNOSIS:  1.  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  2.  Unstable angina new onset with mild troponin elevation.  3.  Progression of disease anomalous circumflex marginal treated with and      drug eluting stent TAXUS stenting, high pressure dilatation as outlined      above successful.  4.  An 85% mid third left anterior descending stenosis high-grade probable      stage percutaneous coronary intervention. 5.  Well-preserved left ventricular function posterior wall motion      abnormality.  6.  Systemic hypertension, normal renal arteries.  7.  Infrarenal abdominal aortic aneurysm moderate size recommend follow-up      ultrasound.  8.  Asymptomatic.  9.  Asymptomatic carotid stenosis; asymptomatic 70-80% left vertebral      stenosis with patent right vertebral and good antegrade flow.  10. Continued cigarette abuse.  11. Hyperlipidemia.       RAW/MEDQ  D:  03/31/2005  T:  03/31/2005  Job:  [id number]   cc:   Vira Browns, M.D.  [facility]

## 2011-02-25 NOTE — Op Note (Signed)
NAMEJAKEEL, STARLIPER NO.:  [id number]   MEDICAL RECORD NO.:  [id number]          PATIENT TYPE:  AMB   LOCATION:  DSC                          FACILITY:  [facility]   PHYSICIAN:  Robert A. Thurston Hole, M.D. DATE OF BIRTH:  Nov 20, 1940   DATE OF PROCEDURE:  11/29/2004  DATE OF DISCHARGE:                                 OPERATIVE REPORT   PREOPERATIVE DIAGNOSES:  1.  Left shoulder partial rotator cuff tear.  2.  Left shoulder partial glenoid labrum tear.  3.  Left shoulder chondromalacia.  4.  Left shoulder bursitis.   POSTOPERATIVE DIAGNOSES:  1.  Left shoulder partial rotator cuff tear.  2.  Left shoulder partial glenoid labrum tear.  3.  Left shoulder chondromalacia.  4.  Left shoulder bursitis.   PROCEDURE:  1.  Left shoulder examination under anesthesia, followed by arthroscopic      debridement of partial rotator cuff tear and partial labrum tear.  2.  Left shoulder chondroplasty.  3.  Left shoulder sub-total bursectomy.   SURGEON:  Elana Alm. Thurston Hole, M.D.   ASSISTANT:  Julien Girt, P.A.   ANESTHESIA:  General.   OPERATIVE TIME:  Was 45 minutes.   COMPLICATIONS:  None.   INDICATIONS FOR PROCEDURE:  The patient is a 70 year old gentleman who had  previously undergone a left shoulder arthroscopy in June 2005.  He had done  well until two months postoperatively when he had a significant re-injury  when he re-injured his shoulder.  Despite persistent therapy and  conservative care, his shoulder has continued to hurt significantly with a  follow-up MRI showing a partial labrum tear and chondromalacia and is now to  undergo an arthroscopy.   DESCRIPTION OF PROCEDURE:  The patient is brought to the operating room on  November 29, 2004, after an inter-Scalene block had been placed in the  holding room by anesthesia.  He was placed on the operating room table in  the supine position.  After being placed under general anesthesia, his left  shoulder was  examined.  He had a full range of motion and his shoulder was  stable to ligamentous examination.  He was then placed in the beach chair  position and the shoulder and the arm were prepped using sterile DuraPrep  and draped using a sterile technique.  He received Ancef 1 g IV  preoperatively for prophylaxis.  Initially through a posterior arthroscopic  portal the arthroscope with a pump attached was placed, and through an  anterior portal an arthroscopic probe was placed.  On initial inspection the  articular cartilage and the glenohumeral joint showed 50%-75% grade 3  chondromalacia which was debrided.  A partial tearing of the anterior,  inferior and posterior labrum of 25%-30% was debrided.  The biceps tendon  anchor and biceps tendon were intact.  The anterior and inferior  glenohumeral ligament complex was intact.  There was some partial tearing of  the anterior inferior glenoid labrum, but it was still attached with the  glenohumeral ligament complex to the glenoid rim.  The rotator cuff showed  multiple striations, with evidence of small partial tears in the  supraspinatus which was debrided, but it was otherwise intact and well-  attached to the bone throughout all the rotator cuff attachments.  The  inferior capsular recess was free of pathology.  At this point the  subacromial space was entered and a lateral arthroscopic portal was made.  There was some significant recurrent bursitis posteriorly and medially and  this was thoroughly debrided.  The rotator cuff on the bursal surface was  intact.  There was no bony impingement, because he had already had a  subacromial decompression and distal clavicle excision.  After the  bursectomy was carried out, the shoulder could be brought through a full  range of motion with no impingement on the rotator cuff.  At this point it  was felt that all pathology had been satisfactorily addressed.  The  instruments were removed.  The portals were  closed with #3-0 nylon sutures.  Sterile dressings and a sling were applied.   The patient was awakened and taken to the recovery room in stable condition.   FOLLOW-UP CARE:  The patient will be followed as an outpatient on Percocet  for pain, with early physical therapy.  I will see him back in the office in  one week for sutures out and followup.      RAW/MEDQ  D:  11/29/2004  T:  11/29/2004  Job:  [id number]

## 2011-02-25 NOTE — Op Note (Signed)
NAME:  [name], [name]                              ACCOUNT NO.:  [id number]   MEDICAL RECORD NO.:  [id number]                   PATIENT TYPE:  AMB   LOCATION:  DSC                                  FACILITY:  [facility]   PHYSICIAN:  Robert A. Thurston Hole, M.D.              DATE OF BIRTH:  [birth year]   DATE OF PROCEDURE:  04/05/2004  DATE OF DISCHARGE:                                 OPERATIVE REPORT   POSTOPERATIVE DIAGNOSES:  1. Left shoulder partial rotator cuff tear and partial labrum tear with     impingement.  2. Left shoulder acromioclavicular joint spurring and degenerative joint     disease.   POSTOPERATIVE DIAGNOSES:  1. Left shoulder partial rotator cuff tear and partial labrum tear with     impingement.  2. Left shoulder acromioclavicular joint spurring and degenerative joint     disease.   PROCEDURES:  1. Left shoulder examination under anesthesia followed by arthroscopic     debridement of partial rotator cuff tear and partial labrum tear.  2. Left shoulder chondroplasty.  3. Left shoulder subacromial decompression.  4. Left shoulder distal clavicle excision.   SURGEON:  Elana Alm. Thurston Hole, M.D.   ASSISTANT:  Julien Girt, P.A.   ANESTHESIA:  General.   OPERATIVE TIME:  45 minutes.   COMPLICATIONS:  None.   INDICATIONS FOR PROCEDURE:  The patient is a 70 year old gentleman who has  had ten to 12 months of increasing left shoulder pain with exam and MRI  documenting a partial rotator cuff tear and a partial labrum tear with  impingement and acromioclavicular joint spurring.  He has failed  conservative care and is now to undergo arthroscopy.   DESCRIPTION OF PROCEDURE:  The patient was brought to the operating room on  April 05, 2004 after an interscalene block had been placed in the holding  room by anesthesia.  He was placed on the operative table in the supine  position.  After being placed under general anesthesia his left shoulder was  examined.  He had forward  flexion to 170, abduction to 160 and internal and  external rotation to 80 degrees.  His shoulder was stable to ligamentous  exam.  He was then placed in the beach chair position and the shoulder and  arm were prepped using sterile Duraprep and draped using sterile technique.  He received Ancef 1 gram IV preoperatively for prophylaxis.  Initially, the  arthroscopy was performed through a posterior arthroscopic portal.  The  arthroscope with a pump attachment was placed and through an anterior portal  an arthroscopic probe was placed.  On initial inspection the articular  cartilage in the glenohumeral joint showed a 40 to 50% grade 3 chondral  defect on the humeral head which was debrided and 30% grade 3 changes on the  glenoid which was debrided.  He had partial tearing of the anterior and  inferior  labrum and superior labrum 25 to 30% and this was debrided.  The  anterior and inferior glenohumeral ligament complex was intact.  The biceps  tendon anchor and the biceps tendon were intact.  The posterior labrum had a  partial tearing of 25 to 30% and this was debrided.  The rotator cuff on the  articular surface showed no evidence of a tear.  The inferior capsular  recess was free of pathology.  The subacromial space was then entered and a  lateral arthroscopic portal was made.  A large amount of bursitis was  resected.  The rotator cuff showed partial fraying and partial tearing on  the bursal side and this was debrided, but it was otherwise intact.  Impingement was noted in the subacromial and decompression was carried out  removing 6 to 8 mm of the under surface of the anterior, anterolateral and  anteromedial acromion.  Calcified CA ligament was also debrided as well.  The acromioclavicular joint showed significant spurring and degenerative  changes and the distal 5 to 6 mm of the clavicle was resected with a 6 mm  bur. After this was done the shoulder could be brought through a full  range  of motion with no impingement on the rotator cuff.  At this point, it was  felt that all pathology had been satisfactorily addressed.  The instruments  were removed.  The portals were closed with 3-0 nylon suture.  Sterile  dressings and a sling were applied.  The patient was awakened and taken to  the recovery room in stable condition.   FOLLOW UP:  The patient will be followed as an outpatient on Vicodin and  Naprosyn. He will be seen back in the office in a week for sutures out and  follow-up.  Begin early physical therapy.                                               Robert A. Thurston Hole, M.D.    RAW/MEDQ  D:  04/05/2004  T:  04/05/2004  Job:  [id number]

## 2011-02-25 NOTE — Cardiovascular Report (Signed)
[place]  Patient:    [name], [name]                           MRN: [id number] Proc. Date: 02/27/01 Adm. Date:  [id number] Attending:  Veneda Melter CC:         Lewayne Bunting, M.D.  Quita Skye Hart [place], M.D.  CV Laboratory  Roxy Manns, M.D. [facility]   Cardiac Catheterization  INDICATIONS:  The patient is a smoker, who is referred for evaluation of shortness of breath.  He had a Cardiolite study done which suggested inferior ischemia.  The patient had Dopplers done which revealed a carotid stenosis. The current study was done to assess coronary anatomy.  PROCEDURES: 1. Left heart catheterization. 2. Selective coronary arteriography. 3. Selective left ventriculography. 4. Distal aortography.  DESCRIPTION OF PROCEDURE:  The procedure was performed from the right femoral artery using 6 French catheters.  He tolerated the procedure without complication.  He was taken to the holding area in satisfactory clinical condition.  HEMODYNAMICS:  The central aortic pressure was 136/69. LV pressure 116/10. There was no gradient on pullback across the aortic valve.  ANGIOGRAPHIC DATA: 1. Ventriculography was performed in the RAO projection.  Overall systolic    function was well preserved.  Ejection fraction was calculated at    54%, but visually was slightly greater than this.  No wall motion    abnormalities were noted and there was not significant mitral    regurgitation. 2. Distal aortography revealed patent renal arteries.  At the distal aspect    of the abdominal aorta prior to the bifurcation there was evidence of    a small abdominal aortic aneurysm.  This has not been previously    recognized. 3. The left main coronary is basically a common ostium providing an    intermediate and LAD.  Of note, the circumflex has it arising from the    proximal portion of the right coronary artery representing a coronary    anomaly. 4. The left main coronary artery is  basically free of significant disease    except for some mild ostial tapering. 5. The LAD proper provides a bifurcating diagonal branch and then bifurcates    at the apex.  The LAD appears to be free of critical disease. 6. There is an intermediate branch.  The intermediate branch has about 30%    proximal narrowing.  After a bifurcation, this vessel tapers rather rapidly    and is very small in caliber giving the appearance of potentially diffuse    disease, although this is not documented or identified. 7. The right coronary artery is a dominant vessel.  It also supplies the    origin of a ______ circumflex vessel.  The right coronary proper has about    20% narrowing in the proximal portion after the anomalous origin and 30%    distal narrowing.  There is some diffuse luminal irregularity throughout.    There is the posterior descending and posterolateral branches, all of which    are free of critical disease.  The anomalous circumflex has a long area of   70-75% narrowing throughout the entire proximal segment leading into the    mid vessel.  This supplies a single marginal branch.  CONCLUSIONS: 1. Preserved left ventricular function. 2. Small abdominal aortic aneurysm. 3. 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  DISPOSITION:  At the present time, my leaning would be in the direction of medical therapy.  We are going to refer him to Dr. Jerilee Field for evaluation of both his abdominal aneurysm and his carotid stenosis.  In addition, we would recommend continued aspirin in attempts of lipid-lowering.  We would also recommend discontinuation of tobacco use.  Followup with Dr. Andee Lineman is warranted for further evaluation of is shortness of breath, possibly including pulmonary function studies and/or other specific tests.  Long-term followup would be with Dr. Roxy Manns. DD:  02/27/01 TD:  02/28/01 Job:  [id number]

## 2011-02-25 NOTE — H&P (Signed)
NAME:  [name], [name] NO.:  [id number]   MEDICAL RECORD NO.:  [id number]          PATIENT TYPE:  AMB   LOCATION:  SDS                          FACILITY:  [facility]   PHYSICIAN:  Abelino Derrick, P.A.   DATE OF BIRTH:  07/23/1941   DATE OF ADMISSION:  12/03/2007  DATE OF DISCHARGE:  12/03/2007                              HISTORY & PHYSICAL   HISTORY OF PRESENT ILLNESS:  Mr .Kwan is a 70 year old male followed by  Dr. Allyson Sabal and Dr. Lamona Curl with history of peripheral vascular  disease and coronary disease.  He has had multiple interventions in the  past, including an intervention of the anomalous circumflex and LAD.  He  has had left common iliac artery stenting with restenting earlier in  2008 as well as an abdominal aortic aneurysm we are following at 3.3 cm.  He saw Dr. Allyson Sabal back in December 2008, and was setup for followup  Doppler study.  He was seen back in the office on November 26, 2007, to  review his Dopplers, which suggested again early in-stent restenosis of  his left iliac stent.  He now is complaining of left leg claudication.  He was setup for elective intervention and was admitted on December 03, 2007, for this.   PAST MEDICAL HISTORY:  1. Remarkable for previous left common iliac artery stenting with      restenting on Feb 21, 2005, as well as October 19, 2006.  2. Treated hypertension.  3. COPD.  4. Treated dyslipidemia.  5. Coronary disease with previous anomalous circumflex and LAD      stenting in 2006 with a Taxus stent.  6. LAD driver stent in October 2006, in the setting of an MI.  7. Prior appendectomy.  8. 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  9. History of prostate problems with a past TUNA procedure.   MEDICATIONS:  1. Plavix 75 mg a day.  2. Metoprolol 25 mg b.i.d.  3. Lipitor, unknown dose daily.  4. Nexium 40 mg a day  5. Spiriva 18 mcg a day.  6. Lovaza 1 daily.  7. Symbicort 80/4.5 two puffs b.i.d.  He  has had problems with other      statins, causing myalgias.   SOCIAL HISTORY:  He is married, he has 2 children and 2 grandchildren.  He has been retired since 2006.  He smokes about a pack a day.  He was  unable to tolerate Chantix in the past, which gave him nightmares.   FAMILY HISTORY:  His mother and father both have dyslipidemia.  His  brother died of cancer, but did have an MI in his 12s.   REVIEW OF SYSTEMS:  Essentially unremarkable except for noted above.  The patient denies any recent chest pain.  The echocardiogram in May  2007, showed good LV function with mild aortic valve sclerosis.  Carotid  Dopplers done on November 2008, show a left internal carotid artery  narrowing of 50% to 69% and right 0% to 49%.   PHYSICAL EXAMINATION:  VITAL  SIGNS:  Blood pressure 144/77, pulse 70,  temperature 97, and respirations 12.  GENERAL:  He is an overweight male, in no acute distress.  HEENT:  Normocephalic.  Extraocular movements are intact.  Sclerae is  nonicteric.  NECK:  Without JVD.  CHEST:  Clear to auscultation and percussion.  CARDIAC:  Reveals regular rate and rhythm.  Normal S1 and S2.  ABDOMEN:  Obese, nontender.  EXTREMITIES:  Reveal diminished distal pulses, left greater than right.  NEUROLOGIC:  Exam grossly intact   IMPRESSION:  1. Claudication with abnormal outpatient ankle-brachial indexes      consistent with in-stent restenosis of previously placed left iliac      stent.  2. Known peripheral vascular disease with prior left iliac stenting in      May 2006, and again in May 2007.  3. Coronary disease with prior left anterior descending and Taxus      stenting in June 2006, with left anterior descending percutaneous      coronary intervention, and driver stenting in October 2006.  4. Hypertension.  5. Smoking and chronic obstructive pulmonary disease.  6. Dyslipidemia   PLAN:  The patient will be admitted for elective pulmonary vein  angiogram.       Abelino Derrick, P.ALenard Lance  D:  01/29/2008  T:  01/30/2008  Job:  [id number]

## 2011-02-25 NOTE — Cardiovascular Report (Signed)
[name], SAR NO.:  [id number]   MEDICAL RECORD NO.:  [id number]          PATIENT TYPE:  INP   LOCATION:  6523                         FACILITY:  [facility]   PHYSICIAN:  Richard A. Alanda Amass, M.D.DATE OF BIRTH:  1941-04-11   DATE OF PROCEDURE:  04/01/2005  DATE OF DISCHARGE:                              CARDIAC CATHETERIZATION   PROCEDURE:  Retrograde central aortic catheterization, selective left  coronary angiography, pre and post intracoronary nitroglycerin  administration, weight-adjusted heparin monitoring ACTs, continued Aggrastat  infusion, continue aspirin and Plavix therapy, direct stenting high grade  LAD mid 85% stenosis with Taxus 2.75/12 DDS stent (16 atmospheres equivalent  to 3.0 mm).   DESCRIPTION OF PROCEDURE:  This 70 year old white married father of two,  grandfather of two, smoker with history of hypertension,  hypercholesterolemia, peripheral arterial disease, remote LEI stenting, and  continued cigarette abuse.  Was admitted on March 30, 2005 with unstable  angina.  There were trace troponins, negative CPKs and no acute EKG changes,  and he was stabilized in the hospital.  He underwent coronary angiography on  March 31, 2005.  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  He had an  85% LAD lesion beyond the large twin third diagonal branch of the LAD and  noncritical lumpy, bumpy disease of his dominant right coronary with 30-40%  narrowing proximal and mid, and 50% mid PLA.  He underwent DES of the  anomalous circumflex with 2.5/20 Taxus Scimed stent with good result on March 31, 2005.  He is brought back today for staged intervention.   Informed consent was obtained to proceed.  He was on continued Aggrastat  infusion, and renal function was stable.  He was hydrated preoperatively,  premedicated with 5 mg of Valium p.o. and brought to the second floor CP  lab.  The left groin was prepped, draped in the usual manner.  1% Xylocaine  was used for local anesthesia.  The LCFA was entered with single anterior  puncture using an 18 thin wall needle.  The patient was given a total of  7000 units of heparin in divided doses in the lab, monitoring ACTs.  The  left coronary was intubated with a JL-3.5 Scimed guiding catheter.  The  lesion of a LAD beyond the twin third diagonal was crossed with a 0.014-  inch flexible tip, soft tip Asahi soft guide wire which was free in the  distal LAD.  The lesion was then crossed with a 2.75/12 Taxus drug-eluting  stent which was positioned fluoroscopically beyond diagonal of the DX3.  It  was deployed at 14-38 and post dilated at 16-30.  Intracoronary  nitroglycerin 200 mcg was given pre and post stent deployment.  The balloon  was removed, and final injection showed stenosis reduction of 85% to less  than 10 with good flow and no dissection, and good stent apposition.  In one  view, there was 0 to -10%  narrowing.  In another view, there was less than  10% narrowing.  The dilatation system was removed.  The patient had chest  pain with balloon inflations, relieved with balloon deflations.   He was given intermittent Versed 2 mg, 50 mcg of Fentanyl, and 2 mg of  Nubain for sedation during the procedure, which he tolerated well.  Dilatation system was removed.  Side arm sheath was flushed and secured to  the skin with #1 silk suture to prevent migration.  The patient was brought  to the holding area for postoperative care in stable condition.   We recommend continued medical therapy of his hyperlipidemia, systemic  hypertension, and residual coronary disease.  He will need to be on aspirin  and Plavix therapy as outlined.  Hopefully, he will be able to discontinue  smoking.  Incidentally, his brother is currently in the hospital in   [place], and unfortunately dying of metastatic CA of the bone.  Arterial  pressures were monitored throughout the procedure.  The patient remained in  sinus rhythm, and blood pressures ranged from 121 mmHg.   CATHETERIZATION DIAGNOSES:  1.  Unstable angina with minor troponin elevation.  2.  Successful culprit lesion drug-eluting stent high grade proximal,      segmental, anomalous circumflex stenosis progression of disease.  3.  Successful staged drug-eluting stenting mid left anterior descending as      outlined above.  4.  Well-preserved left ventricular function.  Ejection fraction      approximately 55% with posterior wall motion abnormality on prior cath.  5.  Systemic hypertension.  Single normal renal arteries.  6.  Infrarenal abdominal aortic aneurysm, sizing to be determined.  7.  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  8.  Widely patent left external iliac stent from remote percutaneous      transluminal angioplasty.  9.  Continued cigarette abuse.  10. Hyperlipidemia.       RAW/MEDQ  D:  04/01/2005  T:  04/01/2005  Job:  [id number]   cc:   Dr. Lennie Muckle, [place]

## 2011-02-25 NOTE — Discharge Summary (Signed)
NAME:  [name], [name]                              ACCOUNT NO.:  [id number]   MEDICAL RECORD NO.:  [id number]                   PATIENT TYPE:  INP   LOCATION:  3709                                 FACILITY:  [facility]   PHYSICIAN:  Mary B. Easley, P.A.-C.             DATE OF BIRTH:  [birth year]   DATE OF ADMISSION:  08/28/2002  DATE OF DISCHARGE:  08/29/2002                                 DISCHARGE SUMMARY   ADMISSION DIAGNOSES:  1. Recurrent episodes of presyncope, diaphoresis, left arm numbness.  2. Peripheral vascular disease.     a. Left carotid artery stenosis.     b. History of stent to left iliac on July 26, 2001.     c. Small abdominal aortic aneurysm.  3. Coronary artery disease.     a. Status post catheterization 2002 with 30% ramus intermedius.        Circumflex 70 to 75%.  4. Hyperlipidemia.  5. Emphysema and chronic bronchitis.  6. Sinusitis over the last few weeks.  7. Question history of transient ischemic attack.  8. History of ruptured and bulging discs with no surgery.  9. History of optical migraines.  10.      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     Nedra Hai.  11.      Benign prostatic hypertrophy with TUNA about one year ago.  12.      Ongoing smoking.   DISCHARGE DIAGNOSES:  1. Recurrent episodes of presyncope, diaphoresis, left arm numbness. Status     post negative cardiac enzymes, electrocardiogram without change.     Telemetry with no arrhythmias.  No further episodes.  Plan for outpatient     Cardiolite for follow up.  2. Peripheral vascular disease.     a. Left carotid artery stenosis.     b. History of stent to left iliac on July 26, 2001.     c. Small abdominal aortic aneurysm.  3. Coronary artery disease.     a. Status post catheterization 2002 with 30% ramus intermedius.        Circumflex with 70 to 75%.  4. Hyperlipidemia.  5. Emphysema and chronic bronchitis.  6. Sinusitis over the last few weeks.  7. Question  history of transient ischemic attack.  8. History of ruptured and bulging discs with no surgery.  9. History of optical migraines.  10.      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     Nedra Hai.  11.      Benign prostatic hypertrophy with TUNA about one year ago.  12.      Ongoing smoking.   HISTORY OF PRESENT ILLNESS:  The patient is a 70 year old white married male  with a history of coronary artery disease with minimal disease in the right  coronary artery and ramus intermedius in the past.  As well he has a nominal  left circumflex with long segments of 70 to 75% stenosis by catheterization  in 2002.  As well the patient does have a history of peripheral vascular  disease.  He has no history of any significant chest pain.  He has been on  disability for about six years but about two months ago decided to go back  to work secondary to financial stress and has been working maintenance at [employer].  I presume he was on disability secondary to his back conditions but  I am uncertain of this.  He recently had some sinusitis last week and was  treated with antibiotics for three days.  On the day of admission the  patient had recurrent episodes of dizziness, left arm weakness and numbness  and was in a cold sweat and felt presyncopal.  There was no associated chest  discomfort or palpitations.   PHYSICAL EXAMINATION:  VITAL SIGNS:  Stable with heart rate 54, blood  pressure 137/70.  Electrocardiogram showed sinus rhythm with no significant  changes.  GENERAL:  The examination otherwise revealed no significant abnormalities.  Laboratories were pending.   At this time the patient was seen and evaluated by Dr. Jacinto Halim.  He was  planned for admission, to check serial cardiac enzymes and rule out  myocardial infarction.  We would monitor him in telemetry to rule out any  underlying arrhythmias.  It was felt if cardiac enzymes were positive then  certainly we would need to  proceed with cardiac catheterization, however, if  negative he could probably be discharged with outpatient Cardiolite.   HOSPITAL COURSE:  On August 29, 2002 the patient has remained stable.  He  has had no further episodes since admission.  He is afebrile at 97.2, pulse  80, blood pressure 125/65, oxygen saturation 92% on room air.  His labs have  remained stable.  TSH is normal and cardiac enzymes are negative X3.  Lipid  profile is significant for elevated triglycerides and low HDL of 28.  Therefore his lipid therapy will be adjusted.  Telemetry has shown sinus  bradycardia, sinus rhythm 55 to 80 beats per minute with no arrhythmia.  Electrocardiogram repeat today shows sinus rhythm with absolutely no ST-T  change.   The patient's examination remains unchanged and with no significant  findings.  At this time the patient is seen and evaluated by Dr. Jacinto Halim who  deems the patient stable for discharge home.  With further questioning it  was actually revealed that his left arm numbness has been chronic for many  years secondary to past left shoulder surgery.  The patient has had no chest  pain, his cardiac enzymes are negative and his electrocardiogram is  absolutely normal and it is felt we should not proceed with the cardiac  catheterization at this time  for ischemic evaluation.  Rather, he can have  an outpatient Cardiolite study.  He is already scheduled for routine carotid  Dopplers for his known coronary artery disease and these will continue.   CONSULTATIONS:  None.   PROCEDURES IN HOSPITAL:  None.   LABORATORY DATA:  Electrocardiogram shows normal sinus rhythm, no ST-T  change.   His cardiac enzymes are negative X3 with CK's of 154, 124, 102, CK-MB's 4.9,  3.4 and 2.4, troponin's 0.02, 0.01, and less than 0.01. White blood cell  count 6.9, hemoglobin 14.3, hematocrit 42.5, platelet count 205,000.  Sodium  138, potassium 3.6, BUN 15, creatinine 0.9, glucose 104.  TSH normal  at 1.888, total cholesterol 207, triglycerides 365, HDL 28, LDL 106.   DISCHARGE MEDICATIONS:  1. Niaspan 500 mg q.d.  The patient thinks he was on Niaspan remotely and     may have had some flushing and he is instructed to take his aspirin 30     minutes prior to the Niaspan, take it with a small low fat snack and take     it right before going to sleep.  2. Welchol as before.  3. Enteric coated aspirin 81 mg one q.d.  4. Garlic as before.  5. Serevent, albuterol and Claritin D as before.   ACTIVITY:  No strenuous activity until you see Dr. Allyson Sabal back in the office.   FOLLOW UP:  Patient is scheduled for an exercise Cardiolite in the office on  Tuesday, September 03, 2002  at 11:45 a.m.  He is instructed in the  following:  1. Nothing to eat after midnight.  2. No caffeine or decaffeinated coffee on the morning of the test.  3. He is to wear no lotions, colognes, perfumes.  4. Comfortable clothes and shoes to walk on the treadmill.  Patient is scheduled to see Dr. Allyson Sabal back on Friday September 13, 2002 at 11  o'clock a.m.   Patient is informed to call the office if he has any recurrent symptoms or  problems prior to his scheduled appointment date.                                               Mary B. Remer Macho, P.A.-C.    MBE/MEDQ  D:  08/29/2002  T:  08/29/2002  Job:  [id number]   cc:   Dr. Morene Antu  [place], [place].

## 2011-02-25 NOTE — Assessment & Plan Note (Signed)
[facility]                             PULMONARY OFFICE NOTE   NAME:[name], [name] LEGRAND                           MRN:          [id number]  DATE:09/06/2006                            DOB:          Jul 31, 1941    HISTORY OF PRESENT ILLNESS:  The patient is a 70 year old white male  patient of Dr. Shelle [name] who has a history of mild emphysematous COPD and  asthmatic bronchitis who continues to smoke.  The patient presents for  an acute office visit complaining that he has had a 3-week history of  progressively worsening productive cough with thick yellow sputum, nasal  congestion and wheezing.  The patient denies any hemoptysis, chest pain,  orthopnea, PND, recent travel or antibiotic use.  The patient had  previously been on Advair and Spiriva; however, he reports he has been  off of Advair for several months, reporting that he had been doing well  on Spiriva up until the last couple weeks.   PAST MEDICAL HISTORY/CURRENT MEDICATIONS:  Past medical history and  current medications were reviewed.   PHYSICAL EXAMINATION:  GENERAL:  The patient is a pleasant obese male in  no acute distress.  VITAL SIGNS:  His is afebrile with stable vital signs.  O2 saturation is  97% on room air.  HEENT:  Nasal mucosa is red.  Conjunctivae not injected.  TMs are  normal.  NECK:  Supple without adenopathy.  LUNGS:  Lung sounds reveal course breath sounds with a few expiratory  wheezes.  CARDIAC:  Regular rate.  ABDOMEN:  Soft and benign.  EXTREMITIES:  Warm without any edema.   IMPRESSION AND PLAN:  Acute tracheobronchitis.  The patient to begin  Omnicef x7 days.  Mucinex DM twice daily.  Endal HD 8 ounces, 1-2  teaspoons every 4-6 hours as needed for cough.  Prednisone taper over  the next week.  The patient was given a Xopenex nebulizer treatment in  the office.  The patient will recheck with Dr. Shelle [name] in 1 month, or  sooner if needed.      [name] Oaks, NP  Electronically Signed      [name] Share, MD,FCCP  Electronically Signed   TP/MedQ  DD: 09/06/2006  DT: 09/06/2006  Job #: [id number]

## 2011-02-25 NOTE — Discharge Summary (Signed)
NAMEMARQUAIL, [name] NO.:  [id number]   MEDICAL RECORD NO.:  [id number]          PATIENT TYPE:  INP   LOCATION:  6523                         FACILITY:  [facility]   PHYSICIAN:  Darlin Priestly, MD  DATE OF BIRTH:  1941/03/07   DATE OF ADMISSION:  03/30/2005  DATE OF DISCHARGE:  04/02/2005                                 DISCHARGE SUMMARY   DISCHARGE DIAGNOSES:  1.  Unstable angina pectoris--status post acute subendocardial myocardial      infarction during this admission treated with two interventions by Dr.      Alanda Amass.  2.  Systemic hypertension.  3.  Well-preserved left ventricular function with an ejection fraction of      55% and stable wall motion abnormality during prior catheterization.  4.  Peripheral vascular disease with infrarenal abdominal aortic aneurysm,      setting to be determined.  5.  Left vertebral artery stenosis of 70% to 80%, asymptomatic.  6.  History of external iliac stenting with remote percutaneous transluminal      angioplasty via patent stent.  7.  Hyperlipidemia.  8.  Continued tobacco abuse.   HISTORY OF PRESENT ILLNESS/HOSPITAL COURSE:  This is a 70 year old Caucasian  gentleman, patient of Dr. Allyson Sabal, who presented to the emergency room with  complaints of chest pain that lasted for 15 minutes and would come and go on  and off. The pain was present for the last three or days, but on the morning  of presentation to the emergency room it was worse with episodes of  awakening from sleep and the patient also had cold sweats and numbness down  his left arm. The patient was admitted to [facility], enzymes  cycled and revealed elevated troponin and MB. The patient was started on IV  nitroglycerin and heparin, and  was scheduled for cardiac catheterization on  March 31, 2005. The first catheterization revealed two culprit lesions, one  in the circumflex system and one in the LAD.   Dr. Alanda Amass performed successful  drug-eluting stent and placement for high-  grade stenosis of the proximal segmental anomalous circumflex stenosis on  March 31, 2005. The patient tolerated the procedure well and was transferred  to the unit in stable condition.   On April 01, 2005, the patient underwent stage PCI and this time his left  anterior descending artery was successfully stented with a drug-eluting  stent. The procedure was carried out without any complications. The patient  tolerated it well and transferred to the unit in stable condition.   The next morning he was assessed by Dr. Jenne Campus and was found to be in  stable condition and discharged home.   During his catheterization the patient was noted to have abdominal aortic  aneurysm and he will be scheduled for outpatient ultrasound of the abdominal  aorta.   DISCHARGE MEDICATIONS:  1.  Plavix 71 mg daily.  2.  Lopressor 25 mg b.i.d.  3.  Aspirin 81 mg daily.  4.  Vytorin 7/80 mg daily.  5.  Advair one puff b.i.d.  6.  Wellbutrin 150 mg b.i.d.  7.  Protonix 40 mg daily.  8.  Nicoderm 40 mg patch daily.  9.  Nitroglycerin 0.4 mg under the tongue as  needed for pain.  10. Albuterol p.r.n. wheezing.   HOSPITAL LABORATORIES:  His hemoglobin was 14.3, hematocrit 21.2 on the day  of discharge. CK was 83, CK-MB 3.4. CBC showed sodium 141, potassium 3.7,  BUN 8, creatinine 1.0.   The patient is being discharged home in stable condition.   DISCHARGE INSTRUCTIONS:  He was advised to eat a low-fat, low-cholesterol  diet, avoid driving, no lifting greater than 5 pounds, or any strenuous  physical activity for five days post catheterization. Dr. Hazle Coca office  will call the patient with an appointment for an abdominal ultrasound.  Follow up with Dr. Allyson Sabal.       MK/MEDQ  D:  04/02/2005  T:  04/03/2005  Job:  [id number]   cc:   [facility]

## 2011-02-25 NOTE — Cardiovascular Report (Signed)
[place]  Patient:    [name], [name] Visit Number: [id number] MRN: [id number]          Service Type: DSU Location: 3700 [id number] Attending Physician:  Berry, Jonathan [place] Dictated by:   Runell Gess, M.D. Proc. Date: 07/26/01 Admit Date:  07/26/2001   CC:         Peripheral Vascular Angiographic Suite             [facility], [address] Lorrin Jackson, M.D. [facility], [facility]                        Cardiac Catheterization  INDICATIONS: The patient is a 70 year old, married white male, father of 2, grandfather of 2 grandchildren, who is a retired Education administrator. His risk factor profile is positive for tobacco abuse and hyperlipidemia. He has noncritical CAD by catheterization. He also has a small abdominal aortic aneurysm, which we are following by Doppler ultrasound. He has complained of left lower extremity claudication with exercise ABIs that reveal a decrease in his left ABI. He also has moderate left internal carotid artery stenosis which we are following by Doppler studies. He presents now for angiography and potential intervention.  DESCRIPTION OF PROCEDURE: The patient was brought to the sixth floor [place] Peripheral Vascular Angiographic Suite in the postabsorptive state. He was premedicated with p.o. Valium.  His right groin was prepped and shaved in the usual sterile fashion.  Xylocaine 1% was used for local anesthesia. A 5 French sheath was inserted into the right femoral artery using standard Seldinger technique.  A 7 French long Cordis sheath was inserted into the left femoral artery using standard Seldinger technique. A 5 French Tennis Racquet catheter was used for mid stream and distal abdominal aortography as well as bifemoral runoff. Visipaque dye was used for the entirety of the case. Retrograde aortic pressures were monitored during the case.  ANGIOGRAPHIC RESULTS: 1.  Renal arteries - normal. 2. Infrarenal abdominal aorta; there is a small saccular infrarenal abdominal    aortic aneurysm terminating at the iliac bifurcation. 3. Left lower extremity:    a. There is an 80% eccentric, fairly focal, proximal left common       iliac artery stenosis with a 20-30 mm ______ gradient.    b. Three-vessel runoff. 4. Right lower extremity: Essentially normal with three-vessel runoff.  PROCEDURE: The patient received 2500 units of heparin intravenously. The 7 French sheath with a dilator in place was used to cross the lesion over an 0.25 Wholey wire. Visipaque dye was used for the entirety of the case. Retrograde aortic pressures monitored. A P204 mounted on a 8 x 2 PowerFlex was then deployed at 10 atmospheres and post-dilated with a 9 x 2 PowerFlex resulting in reduction of the 80% eccentric proximal left common iliac artery stenosis to 0% residual without dissection. The patient tolerated the procedure well.  OVERALL IMPRESSION: Successful left common iliac artery 80% eccentric stenosis stenting with 0% residual obstruction.  PLAN: To discharge the patient after hydration in the morning. He will be discharged home with followup ABIs as well as a duplex ultrasound of his abdominal aortic aneurysm. He will see me back in the office following this. He left the lab in stable condition. Dictated by:   Runell Gess, M.D. Attending Physician:  Berry, Jonathan [place]  DD:  07/26/01 TD:  07/27/01 Job: [id number]

## 2011-02-25 NOTE — Discharge Summary (Signed)
[name], [name] NO.:  [id number]   MEDICAL RECORD NO.:  [id number]          PATIENT TYPE:  INP   LOCATION:  3712                         FACILITY:  [facility]   PHYSICIAN:  Nanetta Batty, M.D.   DATE OF BIRTH:  19-Jun-1941   DATE OF ADMISSION:  08/03/2005  DATE OF DISCHARGE:  08/05/2005                                 DISCHARGE SUMMARY   DISCHARGE DIAGNOSES:  1.  Subendocardial myocardial infarction while off Plavix treated with      percutaneous transluminal coronary angioplasty and driver stent      placement to mid- left anterior descending artery in-stent restenosis.  2.  Percutaneous transluminal coronary angioplasty with an optional diagonal      lesion.  3.  Moderate left ventricular dysfunction by catheterization this admission      with an ejection fraction of 40-45%.  4.  History of prior circumflex intervention June of 2006 and mid-left      anterior descending artery stenting June of 2006.  5.  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  6.  Treated hypertension.  7.  Dyslipidemia  8.  Peripheral vascular disease with left common iliac artery stenting in      the past and known left internal carotid artery stenosis.   HOSPITAL COURSE:  Mr. Granholm is a 70 year old male who has been followed by Dr.  Allyson Sabal. He has peripheral vascular disease and coronary disease as described  above. Recently, he had been taken off his Plavix on July 29, 2005 prior  to having some dental work done. He awakened with chest pain consistent with  unstable angina. He admitted by Dr. Elsie Lincoln and started on IV nitroglycerin  and heparin. Catheterization was done August 03, 2005 by Dr. Jenne Campus which  revealed in-stent restenosis in the mid-LAD lesion. The patient tolerated  the procedure well. He did rule in for an MI with peak CK of 1147 and 100  MBs.   The patient was transferred to telemetry and did well postoperatively. We  feel the patient can be  discharged later on the 27th. He will follow up with  Dr. Allyson Sabal as an outpatient. Dr. Jenne Campus suggested Plavix of a minimum of six  weeks before any surgery is undertaken.   DISCHARGE MEDICATIONS:  1.  Coated aspirin once a day.  2.  Plavix 75 milligrams a day.  3.  Vytorin 10/80 daily.  4.  Lopressor 25 milligrams b.i.d.  5.  Lisinopril 2.5 milligrams twice a day.  6.  Nitroglycerin sublingual p.r.n.   LABS:  White count 7.0, hemoglobin 13.7, hematocrit 38.4, platelets 184,000.  Sodium 141, potassium 4.0, BUN 9, creatinine 1.1. Chest x-ray:  No acute  disease. INR 1.0. EKG reveals sinus rhythm, sinus bradycardia, nonspecific  ST changes.   DISPOSITION:  The patient discharged in stable condition. He will follow up  with Dr. Allyson Sabal in a couple of weeks in the office.      Abelino Derrick, P.A.      Nanetta Batty, M.D.  Electronically Signed    LKK/MEDQ  D:  08/05/2005  T:  08/05/2005  Job:  [id number]

## 2011-02-25 NOTE — Cardiovascular Report (Signed)
NAMEAXAVIER, [name] NO.:  [id number]   MEDICAL RECORD NO.:  [id number]          PATIENT TYPE:  INP   LOCATION:  2922                         FACILITY:  [facility]   PHYSICIAN:  Darlin Priestly, MD  DATE OF BIRTH:  01-18-1941   DATE OF PROCEDURE:  08/03/2005  DATE OF DISCHARGE:                              CARDIAC CATHETERIZATION   PROCEDURE:  1.  Left heart catheterization.  2.  Coronary angiography.  3.  Left ventriculogram.  4.  LAD - mid.  -  Percutaneous transluminal coronary balloon angioplasty.  -  Placement of intracoronary stent.  1.  Diagonal - ostial.  -  Percutaneous transluminal coronary balloon angioplasty.   ATTENDING PHYSICIAN:  Darlin Priestly, MD.   COMPLICATIONS:  None.   INDICATIONS:  Mr. Mehring is a 70 year old male patient of Dr. 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  He underwent percutaneous intervention of his  anomalous circumflex on March 31, 2005, using a drug-eluting stent by Dr.  Alanda Amass.  He had stenting of his mid LAD on April 01, 2005, using a 2.75 x  12 mm Taxus stent.  He was on Plavix for the next three months; however, he  recently had discontinued his Plavix to undergo a neck surgery as well as a  dental surgery.  He presented to the ER this a.m. after awakening at  approximately 4 a.m. with substernal chest pain reminiscent of his prior  angina.  He subsequently was noted to have mild ST elevation in the ER.  He  is now brought for repeat catheterization.   DESCRIPTION OF PROCEDURE:  After informed written consent, the patient was  brought to the cardiac cath lab, and the right groin shaved, prepped, and  draped in the usual sterile fashion.  ECG monitor was established.  Using  the modified Seldinger technique, a #6-French arterial sheath was inserted  in the right femoral artery.  A #6-French diagnostic catheter was used to  perform  diagnostic angiography.   The LAD was a medium size vessel, which had a 40% ostial lesion.  There were  two diagonal branches, with a 30% ostial lesion in the first diagonal.  The  second diagonal had no significant disease.  The LAD stent was noted in the  mid portion of the LAD with complete thrombosis just proximal to the stent.  The proximal LAD had no significant disease.   The left circumflex arose from the right coronary cusp and was noted to have  a stent in its mid portion.  There was mild 30% proximal and distal  narrowing to the stent; however, the stent appeared to be widely patent.  The mid and distal portion of the circumflex had no significant disease.   The right coronary artery was a large vessel, was dominant, which was  irregular and ectatic.  There was diffuse 20 to 30% disease scattered  throughout the RCAP and posterolateral branch.   Left ventriculogram reveals a mildly depressed EF at 40 to  45% with  anterolateral and apical hypokinesis.   HEMODYNAMICS:  Systemic arterial pressure 110/71, LV systolic pressure  122/12, LVEDP of 22.   INTERVENTIONAL PROCEDURE:  LAD - mid:  Final diagnostic angiography:  A #6-  French JL4 guiding catheter was correctly engaged in the left coronary  ostium.  Next, 0.014 ATW marker wires were advanced down the guiding  catheter and used to cross the mid LAD totally stent and positioned in the  apical LAD without difficulty.  Next, a Voyager 3.0 x 8-mm balloon was then  positioned within the previous stent with 2 subsequent inflations at 8  atmospheres; however, the balloon would watermelon-seed out of the stent.  This balloon was ultimately exchanged for a 3.0 x 15-mm Voyager, which we  were then to successfully seat within the stent.  Two inflations to a  maximum of 10 atmospheres were performed for a total of approximately 55  seconds.  Followup angiogram revealed good luminal gain, though there did  appear to be a small  dissection at the proximal portion of the stent.  This  balloon was then removed.  We then selected a driver 3.0 x 18 mm non-DES  stent as the patient was scheduled to undergo a dental surgery for several  broken teeth with ongoing pain.  The stent was then positioned across the  proximal portion of the previously placed stent with careful attention not  to jail the diagonal.  This stent was employed to 9 atmospheres for a total  of 29 seconds.  A second inflation to 14 atmospheres was performed for a  total of 18 seconds.  Followup angiogram revealed no evidence of dissection  or thrombus, with TIMI-3 flow into the LAD, though now there did appear to  be some compromise to the ostial portion of the diagonal.  We did place a  second 0.014 ATW guidewire into the diagonal, and a 2.5 x 15 mm Voyager  balloon was then used to perform 1 inflation across the ostium of the  diagonal.  This balloon was then advanced into the mid portion of the  diagonal, and the 3.0 x 15 mm Voyager balloon was then placed into the LAD.  The diagonal balloon was then pulled back, and kissing-balloon inflation was  then performed across the proximal portion of the LAD stent as well as the  ostial portion of the diagonal, both to 6 atmospheres for a total of 32  seconds.  Followup angiogram revealed no evidence of dissection or thrombus,  with TIMI-3 flow to the distal vessel.  IV Integrilin was used throughout  the case.  Intravenous dose of heparin given to maintain the ACT between 200  and 300.   Final followup angiograms revealed less than 10% residual stenosis in the  LAD, with TIMI-3 to the distal vessel.  In addition, there appeared to be 20  to 30% residual stenosis in the ostium of the diagonal.  At this point, we  elected to conclude the procedure.  All balloons, wires and catheters were  removed.  Hemostatic sheaths were sewn in place.  The patient was transferred back to the ward in stable condition.    CONCLUSIONS:  1.  Successful percutaneous transluminal coronary balloon angioplasty and      placement of a driver 3.0 x 41-LK stent in the mid left anterior      descending in-stent total occlusion.  2.  Successful percutaneous transluminal coronary balloon angioplasty of the      ostial and  diagonal lesion.  3.  Mildly depressed left ventricular systolic function with wall motion      abnormalities as noted above.  4.  Elevated left ventricular end diastolic pressure.  5.  Adjunct use of Integrilin infusion.      Darlin Priestly, MD  Electronically Signed     RHM/MEDQ  D:  08/03/2005  T:  08/04/2005  Job:  [id number]   cc:   Gerlene Burdock A. Alanda Amass, M.D.  Fax: [phone]

## 2011-02-25 NOTE — Op Note (Signed)
NAME:  [name], [name]                              ACCOUNT NO.:  [id number]   MEDICAL RECORD NO.:  [id number]                   PATIENT TYPE:  AMB   LOCATION:  ENDO                                 FACILITY:  [facility]   PHYSICIAN:  Graylin Shiver, M.D.                DATE OF BIRTH:  May 02, 1941   DATE OF PROCEDURE:  11/06/2003  DATE OF DISCHARGE:                                 OPERATIVE REPORT   PROCEDURE PERFORMED:  Flexible sigmoidoscopy with polypectomy.   INDICATIONS FOR PROCEDURE:  The patient is a 70 year old male who underwent  colonoscopy with polypectomy on August 05, 2003.  At that time he had a  large polyp in the distal rectum which was approximately 3 to 4 cm in size.  This was snared and removed in a piecemeal fashion by snare cautery  technique.  It was so large that even a Jumbo snare would not fit around it  completely.  The patient is being brought back today to see if there is any  evidence of residual polyp.  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  There was no evidence of high grade dysplasia.   Informed consent was obtained after explanation of the risks of bleeding,  infection, and perforation.   PREMEDICATIONS:  None.   DESCRIPTION OF PROCEDURE:  With the patient in the left lateral decubitus  position, a rectal exam was performed and no masses were felt.  The Olympus  pediatric colonoscope was inserted into the rectum and advanced into the  sigmoid colon up to 25 cm.  The scope was brought back visualizing the  mucosa.  In the distal rectum, I could see the scar from the prior  polypectomy site. There did not appear to be any residual polyp in this  area.  Around this area there were two small 3 mm rectal polyps in the  distal rectum which were snared with a minisnare and removed by snare  cautery technique.  The cautery sites looked good.  The polyps were  retrieved.  The patient tolerated the procedure well without complications.   IMPRESSION:  Two small polyps in the distal rectum which were removed.  I  saw no visible evidence of residual polyp over the scar area where the prior  polyp was removed.   PLAN:  I am going to recommend a follow-up colonoscopy exam in three years.                                               Graylin Shiver, M.D.    Germain Osgood  D:  11/06/2003  T:  11/06/2003  Job:  [id number]

## 2011-02-25 NOTE — Op Note (Signed)
NAME:  [name], [name]                              ACCOUNT NO.:  [id number]   MEDICAL RECORD NO.:  [id number]                   PATIENT TYPE:  AMB   LOCATION:  ENDO                                 FACILITY:  [facility]   PHYSICIAN:  Graylin Shiver, M.D.                DATE OF BIRTH:  [birth year]   DATE OF PROCEDURE:  08/05/2003  DATE OF DISCHARGE:                                 OPERATIVE REPORT   PROCEDURE:  Colonoscopy with polypectomy and biopsy.   ENDOSCOPIST:  Graylin Shiver, M.D.   INDICATIONS FOR PROCEDURE:  Change in bowel habits, constipation, rule out  colon lesion.   INFORMED CONSENT:  An informed consent was obtained after an explanation of  the risks of bleeding, infection and perforation.   MEDICATIONS:  Fentanyl 125 mcg IV, Versed 12.5 mg IV.   DESCRIPTION OF PROCEDURE:  With the patient in the left lateral decubitus  position, a rectal examination was performed, and no masses were felt.  The  Olympus pediatric colonoscope was inserted into the rectum and advanced  around the colon to the cecum.  The cecal landmarks were identified.  The  cecum and ascending colon were normal.  The transverse colon was normal.  The descending colon and sigmoid were normal.  In the mid-rectum there was a  small 3.0 to 4.0 mm polyp which was biopsied off with cold forceps.  In the  distal rectum, starting just above the anal verge and extending up several  cm was a large sessile feather-appearing polyp approximately 3.4 to 4.0 cm  in size.  This was removed in a piecemeal fashion by snare cautery  technique.  This lesion was large enough that even a jumbo snare would not  fit around it.  Multiple grasps with the jumbo snare were done, with the  removal of the majority of this polypoid lesion.  The base was cleaned up  with touching little pinpoint areas of polypoid-appearing tissue with the  tip of the snare, using cautery.  The polypoid material was placed in a  specimen jar for  histological inspection.  He tolerated the procedure well without obvious complications.   IMPRESSION:  1. Large distal rectal polyp.  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  2. Smaller rectal polyp.   PLAN:  The pathology will be checked.  I will plan to bring this patient  back in two to three months, for a sigmoidoscopy, to reassess the rectal  polypectomy area, and see if any further polypoid material is present,  provided that all of the pathology is benign.  It may be necessary to  further clean up the area, perhaps with the use of the argon plasma  coagulator.  Graylin Shiver, M.D.    Germain Osgood  D:  08/05/2003  T:  08/05/2003  Job:  [id number]

## 2011-02-25 NOTE — Op Note (Signed)
[place]  Patient:    [name], [name]                          MRN: [id number] Proc. Date: 02/02/00 Attending:  Kathy Breach, M.D.                           Operative Report  PREOPERATIVE DIAGNOSIS:  Left parotid mass suspicious on fine needle aspirate to be possibly metastatic squamous cell carcinoma versus low grade mucoepidermoid tumor versus Wharthins tumor.  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  OPERATION PERFORMED:  Left superficial parotidectomy with complete dissection of the facial nerve.  SURGEON:  Kathy Breach, M.D.  ASSISTANTEnrigue Catena H. Pollyann Kennedy, M.D.  ANESTHESIA:  General orotracheal.  DESCRIPTION OF PROCEDURE:  With the patient under general orotracheal anesthesia, the left face was prepped and draped in sterile fashion with complete visualization of the left eye and corner of the mount for monitoring facial nerve function.  A modified Blair incision was marked, ____________ incision was made through skin and subcutaneous tissues with a Shaw scalpel maintaining complete hemostasis.  Incision was carried to the depths of the superficial layer of the parotid fascia and the flap was elevated anterior to the anterior margin of the parotid gland.  The patient had a 1.5 to 2 cm firm, somewhat mobile palpable mass in the immediate preauricular parotid area about 1 cm or good fingerbreadth beneath the inferior margin of the zygoma.  The posterior flap was elevated to the anterior margin of the sternomastoid muscle.  With flaps secured by stay sutures for exposure, the posterior margin of the parotid gland was elevated and dissected off of the cartilaginous external bony canal, upper superior sternomastoid muscle down to the level of the posterior facial vein.  The great auricular nerve was sacrificed as it crossed the anterior margin of the upper sternomastoid muscle.   The main trunk of the facial nerve was then identified dissected medially down the bony external canal anterior face of the mastoid, readily identifying the main trunk of the facial nerve.  Dissection of the lateral portion of the graft lying superficial to the nerve proceeded from down the inferior division working from the inferiormost branches and dissecting peripherally and elevating the gland off the entire inferior division branches.  At the junction of the parotid gland with the posterior facial vein there was a 7 to 8 mm lymph node harvested and sent for frozen section which returned as just hyperplastic lymph node with no evidence of any metastatic squamous cell carcinoma within it.  No significant lymphadenopathy could be palpated or felt or visualized otherwise. The posterior facial vein was divided and ligated with 4-0 silk ties at this position.  Dissection of the facial nerve then returned dissecting the upper branches starting with the most superior and working out each branch going down to the buccal area until all the branches of the facial nerve were completely dissected out to the lateral extent of the gland.  The tumor mass was in the superiormost preauricular area of the parotid gland with normal tissue completely surrounding it and no direct contact with the upper division of facial nerves upon it directly lay.  The lateral lobe of the parotid including the mass was then completely removed in toto.  The entire facial nerve was visibly intact.  Stimulation  of the main trunk at 1 milliamp revealed brisk movement of the lower division and lesser brisk movement of the upper division.  The wound was irrigated and closed with interrupted 3-0 chromic catgut sutures subcutaneously and running 5-0 Surgilon in the skin after placing a 7 mm Jackson-Pratt suction drain in the posterior wound along the anterior margin of the sternomastoid up to the level of the main trunk of the  facial nerve.  Hemostasis throughout the procedure was done with 4-0 silk ties as indicated and light suction cautery and use of the Shaw scalpel otherwise.  Blood loss for the procedure was estimated at 50 cc or less.  The patient tolerated the procedure well and was taken to the recovery room in stable general condition.  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  I discussed whether further node dissection in view of the size of the node well encapsulated and no problem in the harvested tail parotid node, it was felt that further surgical management was of no advantage at this point. DD:  02/02/00 TD:  02/02/00 Job: [id number]

## 2011-04-11 ENCOUNTER — Other Ambulatory Visit: Payer: Self-pay | Admitting: Pulmonary Disease

## 2011-05-16 ENCOUNTER — Ambulatory Visit: Payer: MEDICARE | Admitting: Pulmonary Disease

## 2011-07-13 ENCOUNTER — Encounter: Payer: Self-pay | Admitting: Pulmonary Disease

## 2011-07-14 ENCOUNTER — Ambulatory Visit (INDEPENDENT_AMBULATORY_CARE_PROVIDER_SITE_OTHER): Payer: Medicare Other | Admitting: Pulmonary Disease

## 2011-07-14 ENCOUNTER — Encounter: Payer: Self-pay | Admitting: Pulmonary Disease

## 2011-07-14 VITALS — BP 120/60 | HR 58 | Temp 97.8°F | Ht 71.5 in | Wt 244.8 lb

## 2011-07-14 DIAGNOSIS — Z23 Encounter for immunization: Secondary | ICD-10-CM

## 2011-07-14 DIAGNOSIS — J449 Chronic obstructive pulmonary disease, unspecified: Secondary | ICD-10-CM

## 2011-07-14 NOTE — Assessment & Plan Note (Signed)
The patient has known moderate COPD, and unfortunately continues to smoke.  He is at least on a good bronchodilator regimen, and has not had any recent pulmonary infection or acute exacerbation.  I have asked him to work hard on total smoking cessation, as well as some type of exercise program and weight loss.  I will give him his flu shot today, and he is to followup in 6 months.

## 2011-07-14 NOTE — Patient Instructions (Signed)
Work on total smoking cessation No change in breathing meds Work on exercise and weight loss followup with me in 6mos.

## 2011-07-14 NOTE — Progress Notes (Signed)
  Subjective:    Patient ID: [name], male    DOB: 20-May-1941, 70 y.o.   MRN: [id number]  HPI The patient comes in today for followup of his known COPD.  He is on an excellent bronchodilator regimen, but in general he continues to smoke.  He has had no recent acute exacerbation, and feels that his exertional tolerance is a little better since he's been trying to walk more.  He denies any significant cough, chest congestion, or purulent mucus.   Review of Systems  Constitutional: Negative for fever and unexpected weight change.  HENT: Negative for ear pain, nosebleeds, congestion, sore throat, rhinorrhea, sneezing, trouble swallowing, dental problem, postnasal drip and sinus pressure.   Eyes: Negative for redness and itching.  Respiratory: Positive for shortness of breath. Negative for cough, chest tightness and wheezing.   Cardiovascular: Negative for palpitations and leg swelling.  Gastrointestinal: Negative for nausea and vomiting.  Genitourinary: Negative for dysuria.  Musculoskeletal: Negative for joint swelling.  Skin: Negative for rash.  Neurological: Negative for headaches.  Hematological: Does not bruise/bleed easily.  Psychiatric/Behavioral: Negative for dysphoric mood. The patient is not nervous/anxious.        Objective:   Physical Exam Obese male in no acute distress Nose without purulence or discharge noted Chest with mildly decreased breath sounds, no wheezes or rhonchi Cardiac exam with regular rate and rhythm Lower extremities without edema, no cyanosis noted Alert and oriented, moves all 4 extremities.       Assessment & Plan:

## 2011-09-10 ENCOUNTER — Emergency Department (HOSPITAL_COMMUNITY): Payer: Medicare Other

## 2011-09-10 ENCOUNTER — Emergency Department (HOSPITAL_COMMUNITY)
Admission: EM | Admit: 2011-09-10 | Discharge: 2011-09-11 | Disposition: A | Discharge status: Home / Self Care | Payer: Medicare Other | Attending: Emergency Medicine | Admitting: Emergency Medicine

## 2011-09-10 ENCOUNTER — Other Ambulatory Visit: Payer: Self-pay | Admitting: Pulmonary Disease

## 2011-09-10 DIAGNOSIS — R0609 Other forms of dyspnea: Secondary | ICD-10-CM | POA: Insufficient documentation

## 2011-09-10 DIAGNOSIS — F17200 Nicotine dependence, unspecified, uncomplicated: Secondary | ICD-10-CM | POA: Insufficient documentation

## 2011-09-10 DIAGNOSIS — R0789 Other chest pain: Secondary | ICD-10-CM | POA: Insufficient documentation

## 2011-09-10 DIAGNOSIS — R000 Tachycardia, unspecified: Secondary | ICD-10-CM | POA: Insufficient documentation

## 2011-09-10 DIAGNOSIS — R0989 Other specified symptoms and signs involving the circulatory and respiratory systems: Secondary | ICD-10-CM | POA: Insufficient documentation

## 2011-09-10 DIAGNOSIS — R0602 Shortness of breath: Secondary | ICD-10-CM | POA: Insufficient documentation

## 2011-09-10 DIAGNOSIS — R0600 Dyspnea, unspecified: Secondary | ICD-10-CM

## 2011-09-10 HISTORY — DX: Unspecified osteoarthritis, unspecified site: M19.90

## 2011-09-10 HISTORY — DX: Essential (primary) hypertension: I10

## 2011-09-10 MED ORDER — SODIUM CHLORIDE 0.9 % IV SOLN
INTRAVENOUS | Status: DC
  Administered 2011-09-10 – 2011-09-11 (×2): via INTRAVENOUS

## 2011-09-10 MED ORDER — ALBUTEROL SULFATE (5 MG/ML) 0.5% IN NEBU
5.0000 mg | INHALATION_SOLUTION | Freq: Once | RESPIRATORY_TRACT | Status: AC
  Administered 2011-09-11: 5 mg via RESPIRATORY_TRACT
  Filled 2011-09-10: qty 0.5

## 2011-09-10 NOTE — ED Provider Notes (Signed)
History     CSN: [id number] Arrival date & time: 09/10/2011 10:59 PM   First MD Initiated Contact with Patient 09/10/11 2301      Chief Complaint  Patient presents with  . Shortness of Breath    (Consider location/radiation/quality/duration/timing/severity/associated sxs/prior treatment) HPI The patient presents with shortness of breath and generalized discomfort. He notes that prior to 5 days ago he was in his usual state of health. His symptoms began gradually. He initially noticed congestion, and over subsequent days has had increasing shortness of breath, mildly productive cough, generalized discomfort. He also notes nausea, vomiting, diarrhea, fever near the initiation of his illness, but none of the symptoms are present on presentation. The patient notes mild with Sudafed, no clear exacerbating factors. Past Medical History  Diagnosis Date  . Emphysema   . Dyslipidemia   . Chronic headache   . Decreased hearing   . History of sinusitis   . History of allergic rhinitis   . History of acute bronchitis with bronchospasm     No past surgical history on file.  No family history on file.  History  Substance Use Topics  . Smoking status: Current Everyday Smoker -- 1.5 packs/day    Types: Cigarettes  . Smokeless tobacco: Not on file  . Alcohol Use: Not on file      Review of Systems Gen: Per HPI HEENT: No HA CV: No CP Resp: + dyspnea Abd: Per HPI, otherwise negative Musk: Per HPI, otherwise negative Neuro: No dysesthesia, or focal changes GU: Per HPI, otherwise negative Skin: Neg Psych: Neg  Allergies  Niacin  Home Medications   Current Outpatient Rx  Name Route Sig Dispense Refill  . ALBUTEROL SULFATE HFA 108 (90 BASE) MCG/ACT IN AERS Inhalation Inhale 2 puffs into the lungs every 4 (four) hours as needed.      . ALBUTEROL SULFATE (2.5 MG/3ML) 0.083% IN NEBU Nebulization Take 2.5 mg by nebulization every 6 (six) hours as needed.      . CLOPIDOGREL  BISULFATE 75 MG PO TABS Oral Take 75 mg by mouth daily.      Marland Kitchen METOPROLOL SUCCINATE ER 50 MG PO TB24  1/2 tablet .twice a day     . OMEGA-3-ACID ETHYL ESTERS 1 G PO CAPS  2 tabs daily     . PANTOPRAZOLE SODIUM 40 MG PO TBEC Oral Take 40 mg by mouth daily.      Marland Kitchen SIMVASTATIN 40 MG PO TABS Oral Take 40 mg by mouth at bedtime.      Marland Kitchen SPIRIVA HANDIHALER 18 MCG IN CAPS  INHALE CONTENTS OF 1 CAPSULE ONCE A DAY 30 each 6  . SYMBICORT 160-4.5 MCG/ACT IN AERO  INHALE TWO PUFFS BY MOUTH TWICE DAILY 1 Inhaler 6    BP 173/75  Pulse 97  Temp(Src) 99.6 F (37.6 C) (Oral)  Resp 20  SpO2 96%  Physical Exam  Nursing note and vitals reviewed. Constitutional: He is oriented to person, place, and time. He appears well-developed and well-nourished.  HENT:  Head: Normocephalic and atraumatic.  Eyes: Conjunctivae are normal. Pupils are equal, round, and reactive to light.  Neck: Neck supple.  Cardiovascular: Regular rhythm.  Tachycardia present.   Pulmonary/Chest: No respiratory distress.  Abdominal: Soft. There is no tenderness.  Musculoskeletal: He exhibits no edema.  Neurological: He is alert and oriented to person, place, and time.  Skin: Skin is warm and dry.  Psychiatric: He has a normal mood and affect.    ED Course  Procedures (  including critical care time)   Labs Reviewed  CBC  CARDIAC PANEL(CRET KIN+CKTOT+MB+TROPI)  COMPREHENSIVE METABOLIC PANEL   No results found.   No diagnosis found.  chest X-ray: no acute opacification. Worsened chronic changes   Date: 09/11/2011  Rate: 98  Rhythm: normal sinus rhythm  QRS Axis: normal  Intervals: normal  ST/T Wave abnormalities: normal  Conduction Disutrbances:none  Narrative Interpretation:   Old EKG Reviewed: none available    MDM  This 70 year old male with history of COPD now presents with 1 week of shortness of breath. Absence of pain, the absence of fever, the absence of acute findings on x-ray or EKG or labs is all  reassuring. The patient's elevated CK, and his description of persistent discomfort is suggestive of mild dehydration. The patient received IV fluids, noted mild improvement in symptoms on reevaluation. The patient will be discharged with steroids, antibiotics for likely COPD exacerbation plus/ minus viral infection.        Gerhard Munch, MD 09/11/11 0157

## 2011-09-10 NOTE — ED Notes (Signed)
Patient transported to X-ray 

## 2011-09-11 ENCOUNTER — Encounter (HOSPITAL_COMMUNITY): Payer: Self-pay | Admitting: *Deleted

## 2011-09-11 LAB — COMPREHENSIVE METABOLIC PANEL
ALT: 19 U/L (ref 0–53)
AST: 34 U/L (ref 0–37)
CO2: 24 mEq/L (ref 19–32)
Calcium: 9.2 mg/dL (ref 8.4–10.5)
Creatinine, Ser: 0.99 mg/dL (ref 0.50–1.35)
GFR calc Af Amer: >90 mL/min (ref >90)
GFR calc non Af Amer: 80 mL/min — ABNORMAL LOW (ref >90)
Glucose, Bld: 123 mg/dL — ABNORMAL HIGH (ref 70–99)
Sodium: 135 mEq/L (ref 135–145)
Total Protein: 6.7 g/dL (ref 6.0–8.3)

## 2011-09-11 LAB — CARDIAC PANEL(CRET KIN+CKTOT+MB+TROPI)
CK, MB: 3.9 ng/mL (ref 0.3–4.0)
Relative Index: 0.7 (ref 0.0–2.5)
Total CK: 578 U/L — ABNORMAL HIGH (ref 7–232)

## 2011-09-11 LAB — CBC
MCH: 30.2 pg (ref 26.0–34.0)
MCV: 90.9 fL (ref 78.0–100.0)
RBC: 4.84 MIL/uL (ref 4.22–5.81)

## 2011-09-11 MED ORDER — PREDNISONE 20 MG PO TABS
60.0000 mg | ORAL_TABLET | Freq: Once | ORAL | Status: AC
  Administered 2011-09-11: 60 mg via ORAL
  Filled 2011-09-11: qty 3

## 2011-09-11 MED ORDER — ALBUTEROL SULFATE (5 MG/ML) 0.5% IN NEBU
INHALATION_SOLUTION | RESPIRATORY_TRACT | Status: AC
  Filled 2011-09-11: qty 0.5

## 2011-09-11 MED ORDER — AZITHROMYCIN 250 MG PO TABS: 250.0000 mg | ORAL_TABLET | Freq: Every day | ORAL | Status: AC

## 2011-09-11 MED ORDER — DEXTROSE 5 % IV SOLN
500.0000 mg | Freq: Once | INTRAVENOUS | Status: AC
  Administered 2011-09-11: 500 mg via INTRAVENOUS
  Filled 2011-09-11: qty 500

## 2011-09-11 MED ORDER — PREDNISONE 20 MG PO TABS: 60.0000 mg | ORAL_TABLET | Freq: Once | ORAL | Status: AC

## 2011-09-11 MED ORDER — DIAZEPAM 2 MG PO TABS: 2.0000 mg | ORAL_TABLET | Freq: Every evening | ORAL | Status: AC | PRN

## 2011-09-11 NOTE — ED Notes (Signed)
Patient is resting comfortably. Pt has completed breathing treatment, he is resting quietly lying on right side,

## 2011-11-10 ENCOUNTER — Other Ambulatory Visit: Payer: Self-pay | Admitting: Pulmonary Disease

## 2011-12-06 ENCOUNTER — Encounter (HOSPITAL_COMMUNITY): Payer: Self-pay | Admitting: Pharmacy Technician

## 2011-12-07 ENCOUNTER — Other Ambulatory Visit: Payer: Self-pay | Admitting: Cardiovascular Disease

## 2011-12-13 ENCOUNTER — Encounter (HOSPITAL_COMMUNITY)
Admission: RE | Disposition: A | Discharge status: Home / Self Care | Payer: Self-pay | Source: Ambulatory Visit | Attending: Cardiovascular Disease

## 2011-12-13 ENCOUNTER — Encounter (HOSPITAL_COMMUNITY): Payer: Self-pay | Admitting: General Practice

## 2011-12-13 ENCOUNTER — Ambulatory Visit (HOSPITAL_COMMUNITY)
Admission: RE | Admit: 2011-12-13 | Discharge: 2011-12-14 | Disposition: A | Discharge status: Home / Self Care | Payer: Medicare Other | Source: Ambulatory Visit | Attending: Cardiovascular Disease | Admitting: Cardiovascular Disease

## 2011-12-13 DIAGNOSIS — I714 Abdominal aortic aneurysm, without rupture, unspecified: Secondary | ICD-10-CM | POA: Insufficient documentation

## 2011-12-13 DIAGNOSIS — I10 Essential (primary) hypertension: Secondary | ICD-10-CM | POA: Insufficient documentation

## 2011-12-13 DIAGNOSIS — I70219 Atherosclerosis of native arteries of extremities with intermittent claudication, unspecified extremity: Secondary | ICD-10-CM | POA: Insufficient documentation

## 2011-12-13 DIAGNOSIS — J4489 Other specified chronic obstructive pulmonary disease: Secondary | ICD-10-CM | POA: Insufficient documentation

## 2011-12-13 DIAGNOSIS — I739 Peripheral vascular disease, unspecified: Secondary | ICD-10-CM

## 2011-12-13 DIAGNOSIS — J449 Chronic obstructive pulmonary disease, unspecified: Secondary | ICD-10-CM | POA: Insufficient documentation

## 2011-12-13 DIAGNOSIS — E785 Hyperlipidemia, unspecified: Secondary | ICD-10-CM | POA: Insufficient documentation

## 2011-12-13 DIAGNOSIS — F17200 Nicotine dependence, unspecified, uncomplicated: Secondary | ICD-10-CM | POA: Insufficient documentation

## 2011-12-13 DIAGNOSIS — I2510 Atherosclerotic heart disease of native coronary artery without angina pectoris: Secondary | ICD-10-CM | POA: Insufficient documentation

## 2011-12-13 HISTORY — PX: PERCUTANEOUS STENT INTERVENTION: SHX5500

## 2011-12-13 HISTORY — DX: Personal history of peptic ulcer disease: Z87.11

## 2011-12-13 HISTORY — DX: Personal history of urinary calculi: Z87.442

## 2011-12-13 HISTORY — DX: Other chronic pain: G89.29

## 2011-12-13 HISTORY — DX: Chronic obstructive pulmonary disease, unspecified: J44.9

## 2011-12-13 HISTORY — PX: CORONARY ANGIOPLASTY WITH STENT PLACEMENT: SHX49

## 2011-12-13 HISTORY — DX: Personal history of other diseases of the digestive system: Z87.19

## 2011-12-13 HISTORY — PX: ABDOMINAL ANGIOGRAM: SHX5499

## 2011-12-13 HISTORY — DX: Pneumonia, unspecified organism: J18.9

## 2011-12-13 HISTORY — DX: Low back pain: M54.5

## 2011-12-13 HISTORY — DX: Other specified chronic obstructive pulmonary disease: J44.89

## 2011-12-13 HISTORY — DX: Peripheral vascular disease, unspecified: I73.9

## 2011-12-13 HISTORY — DX: Low back pain, unspecified: M54.50

## 2011-12-13 HISTORY — DX: Unspecified malignant neoplasm of skin of unspecified part of face: C44.300

## 2011-12-13 HISTORY — DX: Atherosclerotic heart disease of native coronary artery without angina pectoris: I25.10

## 2011-12-13 SURGERY — ABDOMINAL ANGIOGRAM
Laterality: Left

## 2011-12-13 MED ORDER — CLOPIDOGREL BISULFATE 75 MG PO TABS: 75.0000 mg | ORAL_TABLET | Freq: Every day | ORAL | Status: DC

## 2011-12-13 MED ORDER — SODIUM CHLORIDE 0.9 % IJ SOLN: 3.0000 mL | INTRAMUSCULAR | Status: DC | PRN

## 2011-12-13 MED ORDER — DIAZEPAM 5 MG PO TABS
5.0000 mg | ORAL_TABLET | ORAL | Status: AC
  Administered 2011-12-13: 5 mg via ORAL

## 2011-12-13 MED ORDER — SIMVASTATIN 40 MG PO TABS
40.0000 mg | ORAL_TABLET | Freq: Every day | ORAL | Status: DC
  Filled 2011-12-13 (×3): qty 1

## 2011-12-13 MED ORDER — LIDOCAINE HCL (PF) 1 % IJ SOLN
INTRAMUSCULAR | Status: AC
  Filled 2011-12-13: qty 30

## 2011-12-13 MED ORDER — SODIUM CHLORIDE 0.9 % IV SOLN: INTRAVENOUS | Status: AC

## 2011-12-13 MED ORDER — SODIUM CHLORIDE 0.9 % IV SOLN
INTRAVENOUS | Status: DC
  Administered 2011-12-13: 11:00:00 via INTRAVENOUS

## 2011-12-13 MED ORDER — ALBUTEROL SULFATE HFA 108 (90 BASE) MCG/ACT IN AERS
2.0000 | INHALATION_SPRAY | RESPIRATORY_TRACT | Status: DC | PRN
  Filled 2011-12-13: qty 6.7

## 2011-12-13 MED ORDER — HEPARIN (PORCINE) IN NACL 2-0.9 UNIT/ML-% IJ SOLN
INTRAMUSCULAR | Status: AC
  Filled 2011-12-13: qty 2000

## 2011-12-13 MED ORDER — MIDAZOLAM HCL 2 MG/2ML IJ SOLN
INTRAMUSCULAR | Status: AC
  Filled 2011-12-13: qty 2

## 2011-12-13 MED ORDER — ACETAMINOPHEN 325 MG PO TABS: 650.0000 mg | ORAL_TABLET | ORAL | Status: DC | PRN

## 2011-12-13 MED ORDER — ONDANSETRON HCL 4 MG/2ML IJ SOLN: 4.0000 mg | Freq: Four times a day (QID) | INTRAMUSCULAR | Status: DC | PRN

## 2011-12-13 MED ORDER — PANTOPRAZOLE SODIUM 40 MG PO TBEC
40.0000 mg | DELAYED_RELEASE_TABLET | Freq: Every day | ORAL | Status: DC
  Administered 2011-12-14: 40 mg via ORAL
  Filled 2011-12-13: qty 1

## 2011-12-13 MED ORDER — FENTANYL CITRATE 0.05 MG/ML IJ SOLN
INTRAMUSCULAR | Status: AC
  Filled 2011-12-13: qty 2

## 2011-12-13 MED ORDER — MORPHINE SULFATE 4 MG/ML IJ SOLN
1.0000 mg | INTRAMUSCULAR | Status: DC | PRN
  Administered 2011-12-13 (×3): 1 mg via INTRAVENOUS
  Filled 2011-12-13 (×2): qty 1

## 2011-12-13 MED ORDER — HEPARIN SODIUM (PORCINE) 1000 UNIT/ML IJ SOLN
INTRAMUSCULAR | Status: AC
  Filled 2011-12-13: qty 1

## 2011-12-13 MED ORDER — MORPHINE SULFATE 4 MG/ML IJ SOLN
INTRAMUSCULAR | Status: AC
  Filled 2011-12-13: qty 1

## 2011-12-13 MED ORDER — CLOPIDOGREL BISULFATE 75 MG PO TABS
75.0000 mg | ORAL_TABLET | Freq: Every day | ORAL | Status: DC
  Administered 2011-12-14: 75 mg via ORAL
  Filled 2011-12-13: qty 1

## 2011-12-13 MED ORDER — DIAZEPAM 5 MG PO TABS
ORAL_TABLET | ORAL | Status: AC
  Filled 2011-12-13: qty 1

## 2011-12-13 MED ORDER — TIOTROPIUM BROMIDE MONOHYDRATE 18 MCG IN CAPS
18.0000 ug | ORAL_CAPSULE | Freq: Every day | RESPIRATORY_TRACT | Status: DC
  Filled 2011-12-13: qty 5

## 2011-12-13 MED ORDER — METOPROLOL SUCCINATE ER 25 MG PO TB24
25.0000 mg | ORAL_TABLET | Freq: Two times a day (BID) | ORAL | Status: DC
  Administered 2011-12-13 – 2011-12-14 (×2): 25 mg via ORAL
  Filled 2011-12-13 (×3): qty 1

## 2011-12-13 MED ORDER — ALBUTEROL SULFATE (5 MG/ML) 0.5% IN NEBU
2.5000 mg | INHALATION_SOLUTION | Freq: Four times a day (QID) | RESPIRATORY_TRACT | Status: DC
  Administered 2011-12-13 – 2011-12-14 (×2): 2.5 mg via RESPIRATORY_TRACT
  Filled 2011-12-13 (×2): qty 0.5

## 2011-12-13 MED ORDER — BUDESONIDE-FORMOTEROL FUMARATE 160-4.5 MCG/ACT IN AERO
2.0000 | INHALATION_SPRAY | Freq: Two times a day (BID) | RESPIRATORY_TRACT | Status: DC
  Administered 2011-12-13 – 2011-12-14 (×2): 2 via RESPIRATORY_TRACT
  Filled 2011-12-13: qty 6

## 2011-12-13 NOTE — Progress Notes (Signed)
Site area: left groin  Site Prior to Removal:  Level 0  Pressure Applied For 30 MINUTES    Minutes Beginning at 1800 Manual:   yes  Patient Status During Pull:  stable  Post Pull Groin Site:  Level 0  Post Pull Instructions Given:  yes  Post Pull Pulses Present:  yes  Dressing Applied:  yes  Comments:  Applied gauze dressing secured with medipore tape

## 2011-12-13 NOTE — Op Note (Signed)
[name] is a 71 y.o. male    [id number] LOCATION:  FACILITY: [facility]  PHYSICIAN: Nanetta Batty, M.D. 1940/12/18   DATE OF PROCEDURE:  12/13/2011  DATE OF DISCHARGE:  [facility]  PV Intervention    History obtained from chart review. [name] is a 71 year old married Caucasian male father of 2 with a history of coronary artery disease and peripheral vascular occlusive disease. He has had multiple coronary interventions in the past on the circumflex and LAD. His other problems include hypertension, hypolipidemia and COPD with ongoing tobacco abuse. I stented his left common iliac artery twice he does have a small abdominal aortic aneurysm measuring 3.6 cm x 3.6 cm. His moderate carotid disease as well. Is complaining of left greater than right lower extremity claudication with Dopplers and echo in our office suggesting high-grade "in-stent restenosis on the left with moderate disease on the right. Has not angiography and potential endovascular intervention for lifestyle limiting claudication.   PROCEDURE DESCRIPTION:    The patient was brought to the second floor  [place] [facility] cath lab in the postabsorptive state. He was  premedicated with Valium by mouth, and Versed as well as fentanyl IV.Marland Kitchen His left groin was prepped and shaved in usual sterile fashion. Xylocaine 1% was used  for local anesthesia. A 7 French bright tip sheath was inserted into the left common femoral  artery using standard Seldinger technique. The patient received  6500 units  of heparin  intravenously.  A 5 French pigtail catheter was used for abdominal aortography. A 5 French end hole was used for obtaining a pullback gradient across the left common iliac artery stent after administration of 200 mcg were intra-arterial nitroglycerin via the SideArm sheath. A pullback gradient was measured at approximately 40 mm of mercury.    HEMODYNAMICS:    AO SYSTOLIC/AO DIASTOLIC: 143/56     Angiographic results: There was a small infrarenal abdominal aortic aneurysm noted. The previously placed stents looked patent and there was some "hypodensity noted. The right common iliac artery appeared patent. Based on the pullback gradient as well as the patient's symptoms and Doppler evidence of high-frequency signal, we'll proceed with PTA and restenting using an Icast covered stent.  Procedure description: The patient received 55 hernias of heparin intravenously. A total of 50 cc of contrast was administered to the patient. Using an 035 Wholey wire along with a 9 mm diameter by 30 mm long I. cast covered stent, restenting was performed for presumed "in-stent restenosis". This was deployed at 10 atmospheres resulting in a widely patent left common iliac artery.  IMPRESSION: Successful restenting of a previously stented left common iliac artery using a covered stent with excellent angiographic result. Patient will be to aspirin products. The sheath will be removed once the ACT falls below 170 a partial will be held on the groin to achieve hemostasis. Patient will be hydrated overnight and discharge him in the morning. Will get follow Dopplers in our office at which time he'll be scheduled to see me back.  Runell Gess MD, [facility] 12/13/2011 2:20 PM

## 2011-12-13 NOTE — H&P (Signed)
  H & P will be scanned in.  Pt was reexamined and existing H & P reviewed. No changes found.  Runell Gess, MD [facility] 12/13/2011 1:39 PM

## 2011-12-14 DIAGNOSIS — I739 Peripheral vascular disease, unspecified: Secondary | ICD-10-CM

## 2011-12-14 LAB — BASIC METABOLIC PANEL
BUN: 14 mg/dL (ref 6–23)
Creatinine, Ser: 0.91 mg/dL (ref 0.50–1.35)
GFR calc Af Amer: >90 mL/min (ref >90)
GFR calc non Af Amer: 83 mL/min — ABNORMAL LOW (ref >90)

## 2011-12-14 LAB — CBC
MCHC: 33.4 g/dL (ref 30.0–36.0)
RDW: 14.2 % (ref 11.5–15.5)

## 2011-12-14 MED ORDER — ASPIRIN EC 81 MG PO TBEC
81.0000 mg | DELAYED_RELEASE_TABLET | Freq: Once | ORAL | Status: AC
  Administered 2011-12-14: 81 mg via ORAL
  Filled 2011-12-14: qty 1

## 2011-12-14 MED ORDER — ASPIRIN 81 MG PO TBEC: 81.0000 mg | DELAYED_RELEASE_TABLET | Freq: Every day | ORAL | Status: DC

## 2011-12-14 MED ORDER — ASPIRIN 81 MG PO TBEC: 81.0000 mg | DELAYED_RELEASE_TABLET | Freq: Once | ORAL | Status: DC

## 2011-12-14 NOTE — Consult Note (Signed)
[name] does not want to quit and refuses education info.

## 2011-12-14 NOTE — Discharge Summary (Signed)
Physician Discharge Summary  Patient ID: [name] MRN: [id number] DOB/AGE: [birth year] 71 y.o.  Admit date: 12/13/2011 Discharge date: 12/14/2011  Admission Diagnoses:  Claudication  Discharge Diagnoses:  Principal Problem:  *Peripheral vascular disease Active Problems:  DYSLIPIDEMIA   Discharged Condition: stable  Hospital Course:  [name] is a 71 year old married Caucasian male father of 2 with a history of coronary artery disease and peripheral vascular occlusive disease. He has had multiple coronary interventions in the past on the circumflex and LAD. His other problems include hypertension, hypolipidemia and COPD with ongoing tobacco abuse. Dr. Allyson Sabal stented his left common iliac artery twice.  He does have a small abdominal aortic aneurysm measuring 3.6 cm x 3.6 cm. He has moderate carotid disease as well.  He was complaining of left greater than right lower extremity claudication with Dopplers and echo in our office suggesting high-grade "in-stent restenosis on the left with moderate disease on the right. Has not angiography and potential endovascular intervention for lifestyle limiting claudication.  He presented fopr PV angiogram and possible intervention. Successful restenting of a previously stented left common iliac artery using a covered stent with excellent angiographic result.  He has been seen by Dr. Allyson Sabal who feels he is stable for DC home.  FU dopplers and office visit.  Consults: None  Significant Diagnostic Studies:  PV Intervention  History obtained from chart review. [name] is a 71 year old married Caucasian male father of 2 with a history of coronary artery disease and peripheral vascular occlusive disease. He has had multiple coronary interventions in the past on the circumflex and LAD. His other problems include hypertension, hypolipidemia and COPD with ongoing tobacco abuse. I stented his left common iliac artery twice he does have a small abdominal aortic aneurysm  measuring 3.6 cm x 3.6 cm. His moderate carotid disease as well. Is complaining of left greater than right lower extremity claudication with Dopplers and echo in our office suggesting high-grade "in-stent restenosis on the left with moderate disease on the right. Has not angiography and potential endovascular intervention for lifestyle limiting claudication.  PROCEDURE DESCRIPTION:  The patient was brought to the second floor  [place] [facility] lab in the postabsorptive state. He was  premedicated with Valium by mouth, and Versed as well as fentanyl IV.Marland Kitchen His left groin  was prepped and shaved in usual sterile fashion. Xylocaine 1% was used  for local anesthesia. A 7 French bright tip sheath was inserted into the left common femoral  artery using standard Seldinger technique. The patient received  6500 units of heparin intravenously. A 5 French pigtail catheter was used for abdominal aortography. A 5 French end hole was used for obtaining a pullback gradient across the left common iliac artery stent after administration of 200 mcg were intra-arterial nitroglycerin via the SideArm sheath. A pullback gradient was measured at approximately 40 mm of mercury.  HEMODYNAMICS:  AO SYSTOLIC/AO DIASTOLIC: 143/56  Angiographic results: There was a small infrarenal abdominal aortic aneurysm noted. The previously placed stents looked patent and there was some "hypodensity noted. The right common iliac artery appeared patent. Based on the pullback gradient as well as the patient's symptoms and Doppler evidence of high-frequency signal, we'll proceed with PTA and restenting using an Icast covered stent.  Procedure description: The patient received 55 hernias of heparin intravenously. A total of 50 cc of contrast was administered to the patient. Using an 035 Wholey wire along with a 9 mm diameter by 30 mm long I. cast  covered stent, restenting was performed for presumed "in-stent restenosis". This was deployed at  10 atmospheres resulting in a widely patent left common iliac artery.  IMPRESSION: Successful restenting of a previously stented left common iliac artery using a covered stent with excellent angiographic result. Patient will be to aspirin products. The sheath will be removed once the ACT falls below 170 a partial will be held on the groin to achieve hemostasis. Patient will be hydrated overnight and discharge him in the morning. Will get follow Dopplers in our office at which time he'll be scheduled to see me back.  Treatments: Successful restenting of a previously stented left common iliac artery using a covered stent   Discharge Exam: Blood pressure 152/82, pulse 60, temperature 97.7 F (36.5 C), temperature source Oral, resp. rate 16, height 5' 11.5" (1.816 m), weight 109.8 kg (242 lb 1 oz), SpO2 97.00%.   Disposition: 01-Home or Self Care  Discharge Orders    Future Appointments: Provider: Department: Dept Phone: Center:   01/12/2012 9:00 AM Barbaraann Share, MD [facility] [phone] None     Future Orders Please Complete By Expires   Diet - low sodium heart healthy      Increase activity slowly      Discharge instructions      Comments:   No lifting or driving for 3 days.     Medication List  As of 12/14/2011  9:26 AM   TAKE these medications         albuterol (2.5 MG/3ML) 0.083% nebulizer solution   Commonly known as: PROVENTIL   Take 2.5 mg by nebulization every 6 (six) hours as needed. For wheezing      albuterol 108 (90 BASE) MCG/ACT inhaler   Commonly known as: PROVENTIL HFA;VENTOLIN HFA   Inhale 2 puffs into the lungs every 4 (four) hours as needed. For wheezing.      aspirin 81 MG EC tablet   Take 1 tablet (81 mg total) by mouth daily. Swallow whole.      budesonide-formoterol 160-4.5 MCG/ACT inhaler   Commonly known as: SYMBICORT   Inhale 2 puffs into the lungs 2 (two) times daily.      clopidogrel 75 MG tablet   Commonly known as: PLAVIX   Take 75 mg by  mouth daily.      metoprolol succinate 50 MG 24 hr tablet   Commonly known as: TOPROL-XL   Take 25 mg by mouth 2 (two) times daily.      pantoprazole 40 MG tablet   Commonly known as: PROTONIX   Take 40 mg by mouth daily.      simvastatin 40 MG tablet   Commonly known as: ZOCOR   Take 40 mg by mouth at bedtime.      tiotropium 18 MCG inhalation capsule   Commonly known as: SPIRIVA   Place 18 mcg into inhaler and inhale daily.             SignedDwana Melena 12/14/2011, 9:26 AM

## 2011-12-14 NOTE — Progress Notes (Signed)
[facility]  Subjective: His back hurts and has for 30 years.  No CP or SOB.  Tenderness in left groin but improving.  Objective: Vital signs in last 24 hours: Temp:  [96.9 F (36.1 C)-98.2 F (36.8 C)] 97.9 F (36.6 C) (03/06 0433) Pulse Rate:  [56-78] 60  (03/06 0433) Resp:  [15-21] 15  (03/06 0017) BP: (124-174)/(52-78) 124/58 mmHg (03/06 0433) SpO2:  [92 %-99 %] 97 % (03/06 0750) Weight:  [108.863 kg (240 lb)-109.8 kg (242 lb 1 oz)] 109.8 kg (242 lb 1 oz) (03/06 0433) Last BM Date: 12/12/11  Intake/Output from previous day: 03/05 0701 - 03/06 0700 In: 750 [P.O.:600; I.V.:150] Out: 675 [Urine:675] Intake/Output this shift:    Medications Current Facility-Administered Medications  Medication Dose Route Frequency Provider Last Rate Last Dose  . 0.9 %  sodium chloride infusion   Intravenous Continuous Runell Gess, MD      . acetaminophen (TYLENOL) tablet 650 mg  650 mg Oral Q4H PRN Runell Gess, MD      . albuterol (PROVENTIL HFA;VENTOLIN HFA) 108 (90 BASE) MCG/ACT inhaler 2 puff  2 puff Inhalation Q4H PRN Runell Gess, MD      . albuterol (PROVENTIL) (5 MG/ML) 0.5% nebulizer solution 2.5 mg  2.5 mg Nebulization QID Runell Gess, MD   2.5 mg at 12/14/11 0746  . budesonide-formoterol (SYMBICORT) 160-4.5 MCG/ACT inhaler 2 puff  2 puff Inhalation BID Runell Gess, MD   2 puff at 12/14/11 0747  . clopidogrel (PLAVIX) tablet 75 mg  75 mg Oral QAC breakfast Runell Gess, MD      . diazepam (VALIUM) 5 MG tablet           . diazepam (VALIUM) tablet 5 mg  5 mg Oral On Call Runell Gess, MD   5 mg at 12/13/11 1123  . fentaNYL (SUBLIMAZE) 0.05 MG/ML injection           . heparin 1000 UNIT/ML injection           . heparin 2-0.9 UNIT/ML-% infusion           . lidocaine (XYLOCAINE) 1 % injection           . metoprolol succinate (TOPROL-XL) 24 hr tablet 25 mg  25 mg Oral BID Runell Gess, MD   25 mg at 12/13/11 2224  .  midazolam (VERSED) 2 MG/2ML injection           . morphine 4 MG/ML injection 1 mg  1 mg Intravenous Q1H PRN Runell Gess, MD   1 mg at 12/13/11 2231  . ondansetron (ZOFRAN) injection 4 mg  4 mg Intravenous Q6H PRN Runell Gess, MD      . pantoprazole (PROTONIX) EC tablet 40 mg  40 mg Oral Daily Runell Gess, MD      . simvastatin (ZOCOR) tablet 40 mg  40 mg Oral q1800 Runell Gess, MD      . tiotropium [facility]) inhalation capsule 18 mcg  18 mcg Inhalation Daily Runell Gess, MD      . DISCONTD: 0.9 %  sodium chloride infusion   Intravenous Continuous Runell Gess, MD 75 mL/hr at 12/13/11 1122    . DISCONTD: clopidogrel (PLAVIX) tablet 75 mg  75 mg Oral Q breakfast Runell Gess, MD      . DISCONTD: sodium chloride 0.9 % injection 3 mL  3 mL Intravenous PRN Runell Gess, MD  PE: General appearance: alert, cooperative and no distress Lungs: clear to auscultation bilaterally Heart: regular rate and rhythm, S1, S2 normal, no murmur, click, rub or gallop Extremities: No LEE Pulses: Radials 2+ and symmetric.  2+ right DP and PT.  Left DP not palpable but foot is warm. Left groin: Mildly tender,  No ecchymosis, hematoma or erythema.  Lab Results:   Basename 12/14/11 0430  WBC 7.7  HGB 14.6  HCT 43.7  PLT 189   BMET  Basename 12/14/11 0430  NA 138  K 3.8  CL 106  CO2 24  GLUCOSE 105*  BUN 14  CREATININE 0.91  CALCIUM 9.2    Studies/Results: 12/13/11, PV Angio, Stenting HEMODYNAMICS:  AO SYSTOLIC/AO DIASTOLIC: 143/56  Angiographic results: There was a small infrarenal abdominal aortic aneurysm noted. The previously placed stents looked patent and there was some "hypodensity noted. The right common iliac artery appeared patent. Based on the pullback gradient as well as the patient's symptoms and Doppler evidence of high-frequency signal, we'll proceed with PTA and restenting using an Icast covered stent.  Procedure description: The patient  received 55 hernias of heparin intravenously. A total of 50 cc of contrast was administered to the patient. Using an 035 Wholey wire along with a 9 mm diameter by 30 mm long I. cast covered stent, restenting was performed for presumed "in-stent restenosis". This was deployed at 10 atmospheres resulting in a widely patent left common iliac artery.  IMPRESSION: Successful restenting of a previously stented left common iliac artery using a covered stent with excellent angiographic result. Patient will be to aspirin products. The sheath will be removed once the ACT falls below 170 a partial will be held on the groin to achieve hemostasis. Patient will be hydrated overnight and discharge him in the morning. Will get follow Dopplers in our office at which time he'll be scheduled to see me back.       Assessment/Plan   Principal Problem:  *Peripheral vascular disease  Plan:  S/P PV angio and stenting of left common iliac.  DC home today with OP dopplers and FU.  Plavix, statin.   LOS: 1 day    [name],[name] 12/14/2011 8:16 AM   Agree with note written by Jones Skene PAC  S/P LCIA PTA/Stent with Icast covered stent for ISR. Groin looks great. Labs ok. For D/C home this AM. LEA then [name] with me 2-3 weeks.  Runell Gess 12/14/2011 8:40 AM

## 2011-12-16 LAB — POCT ACTIVATED CLOTTING TIME
Activated Clotting Time: 199 seconds
Activated Clotting Time: 226 seconds

## 2012-01-12 ENCOUNTER — Encounter: Payer: Self-pay | Admitting: Pulmonary Disease

## 2012-01-12 ENCOUNTER — Ambulatory Visit (INDEPENDENT_AMBULATORY_CARE_PROVIDER_SITE_OTHER): Payer: Medicare Other | Admitting: Pulmonary Disease

## 2012-01-12 VITALS — BP 122/68 | HR 76 | Temp 97.5°F | Ht 71.5 in | Wt 250.0 lb

## 2012-01-12 DIAGNOSIS — J449 Chronic obstructive pulmonary disease, unspecified: Secondary | ICD-10-CM

## 2012-01-12 NOTE — Patient Instructions (Signed)
Stay on current medications Stop smoking If you are getting sick, please call us early so we can keep you from going to hospital.  followup with me in 6mos.

## 2012-01-12 NOTE — Progress Notes (Signed)
  Subjective:    Patient ID: [name], male    DOB: 05/21/1941, 71 y.o.   MRN: [id number]  HPI Patient comes in today for followup of his known COPD.  He has been staying compliant with his medications, but unfortunately continues to smoke excessively.  He had what sounds like a COPD exacerbation in December of last year, but subsequently has returned to baseline.  He denies any significant chest congestion or purulent mucus currently.   Review of Systems  Constitutional: Negative for fever and unexpected weight change.  HENT: Positive for congestion, rhinorrhea, sneezing, postnasal drip and sinus pressure. Negative for ear pain, nosebleeds, sore throat, trouble swallowing and dental problem.   Eyes: Positive for redness and itching.  Respiratory: Positive for cough and wheezing. Negative for chest tightness and shortness of breath.   Cardiovascular: Negative for palpitations and leg swelling.  Gastrointestinal: Negative for nausea and vomiting.  Genitourinary: Negative for dysuria.  Musculoskeletal: Negative for joint swelling.  Skin: Negative for rash.  Neurological: Positive for headaches.  Hematological: Does not bruise/bleed easily.  Psychiatric/Behavioral: Negative for dysphoric mood. The patient is not nervous/anxious.        Objective:   Physical Exam Overweight male in no acute distress Nose without discharge or purulence noted Chest with decreased breath sounds, a few rhonchi, but no active wheezing or crackles Cardiac exam regular rate and rhythm Lower extremities without edema, no cyanosis noted Cor and oriented, moves all 4 extremities.       Assessment & Plan:

## 2012-01-12 NOTE — Assessment & Plan Note (Signed)
The pt is near his usual baseline currently, and unfortunately is still smoking.  He also had an acute exacerbation 09/2011.  I have asked him to stay on his current bronchodilator regimen, but reminded him that his flareups will continue to be a problem until he totally quits smoking.

## 2012-03-12 ENCOUNTER — Other Ambulatory Visit: Payer: Self-pay | Admitting: Pulmonary Disease

## 2012-04-11 ENCOUNTER — Other Ambulatory Visit: Payer: Self-pay | Admitting: Pulmonary Disease

## 2012-07-12 ENCOUNTER — Ambulatory Visit: Payer: Medicare Other | Admitting: Pulmonary Disease

## 2012-08-14 ENCOUNTER — Ambulatory Visit: Payer: Medicare Other | Admitting: Pulmonary Disease

## 2012-09-21 ENCOUNTER — Other Ambulatory Visit: Payer: Self-pay | Admitting: Pulmonary Disease

## 2012-09-21 MED ORDER — ALBUTEROL SULFATE (2.5 MG/3ML) 0.083% IN NEBU: 2.5000 mg | INHALATION_SOLUTION | Freq: Four times a day (QID) | RESPIRATORY_TRACT | Status: DC | PRN

## 2012-10-15 ENCOUNTER — Ambulatory Visit (INDEPENDENT_AMBULATORY_CARE_PROVIDER_SITE_OTHER)
Admission: RE | Admit: 2012-10-15 | Discharge: 2012-10-15 | Disposition: A | Discharge status: Home / Self Care | Payer: Medicare Other | Source: Ambulatory Visit | Attending: Adult Health | Admitting: Adult Health

## 2012-10-15 ENCOUNTER — Ambulatory Visit (INDEPENDENT_AMBULATORY_CARE_PROVIDER_SITE_OTHER): Payer: Medicare Other | Admitting: Adult Health

## 2012-10-15 ENCOUNTER — Encounter: Payer: Self-pay | Admitting: Adult Health

## 2012-10-15 VITALS — BP 132/66 | HR 68 | Temp 97.9°F | Ht 71.5 in | Wt 251.8 lb

## 2012-10-15 DIAGNOSIS — R042 Hemoptysis: Secondary | ICD-10-CM

## 2012-10-15 DIAGNOSIS — J449 Chronic obstructive pulmonary disease, unspecified: Secondary | ICD-10-CM

## 2012-10-15 MED ORDER — PREDNISONE 10 MG PO TABS: ORAL_TABLET | ORAL | Status: DC

## 2012-10-15 MED ORDER — MOXIFLOXACIN HCL 400 MG PO TABS: 400.0000 mg | ORAL_TABLET | Freq: Every day | ORAL | Status: DC

## 2012-10-15 NOTE — Patient Instructions (Addendum)
Avelox 400 mg daily for 7 days, take with food. Prednisone taper. Over the next week. As directed. Mucinex DM twice daily as needed. For cough and congestion. I will call with  x-ray results. Follow up in 2 weeks with Dr. Shelle Iron and  as needed. Hold plavix if coughing up blood increases - call our office immediately  Please contact office for sooner follow up if symptoms do not improve or worsen or seek emergency care  Must quit smoking

## 2012-10-16 ENCOUNTER — Ambulatory Visit: Payer: Medicare Other | Admitting: Pulmonary Disease

## 2012-10-16 ENCOUNTER — Encounter: Payer: Self-pay | Admitting: *Deleted

## 2012-10-16 ENCOUNTER — Encounter: Payer: Self-pay | Admitting: Adult Health

## 2012-10-16 NOTE — Progress Notes (Signed)
Ov reviewed, and agree with plan as outlined.  

## 2012-10-16 NOTE — Progress Notes (Signed)
Quick Note:  ATC pt at both numbers in chart - #'s are disconnected. Letter sent to pt's home asking him to call for these results. ______

## 2012-10-16 NOTE — Assessment & Plan Note (Addendum)
Exacerbation in pt w/ ongoing smoking  Extensive smoking cessation discussed with pt . Along with healthy lifestyle changes encouraged.  Advised if blood tinged mucus persists to hold Plavix  and alert our office immediately  Xray is pending  tx w/ ABX and steroids with close follow up back in office   Plan  Avelox 400 mg daily for 7 days, take with food. Prednisone taper. Over the next week. As directed. Mucinex DM twice daily as needed. For cough and congestion. I will call with  x-ray results. Follow up in 2 weeks with Dr. Shelle Iron and  as needed. Hold plavix if coughing up blood increases - call our office immediately  Please contact office for sooner follow up if symptoms do not improve or worsen or seek emergency care  Must quit smoking

## 2012-10-16 NOTE — Progress Notes (Signed)
  Subjective:    Patient ID: [name], male    DOB: August 09, 1941, 72 y.o.   MRN: [id number]  HPI 72 yo male with known hx of COPD -active smoker   10/16/11 Acute OV  Complains of prod cough with light yellow mucus, mucus was speckled with BRB x3days,  Has had more increased SOB, wheezing, tightness in chest, occ bilateral lung discomfort x1.32months on/off for for several weeks.   No frank hemoptysis . No exertional chest pain or pleuritic pain No calf pain or swelling.  On plavix most days. No hematuria or bloody stools.  Still smoking. We discussed smoking cessation in detail .  No fever, n/v/d or flu like symptoms.  OTC not helping.  Remains on symbicort and spiriva w/ no missed doses.     Review of Systems Constitutional:   No  weight loss, night sweats,  Fevers, chills, fatigue, or  lassitude.  HEENT:   No headaches,  Difficulty swallowing,  Tooth/dental problems, or  Sore throat,                No sneezing, itching, ear ache, nasal congestion, + post nasal drip,   CV:  No chest pain,  Orthopnea, PND, swelling in lower extremities, anasarca, dizziness, palpitations, syncope.   GI  No heartburn, indigestion, abdominal pain, nausea, vomiting, diarrhea, change in bowel habits, loss of appetite, bloody stools.   Resp:  No chest wall deformity  Skin: no rash or lesions.  GU: no dysuria, change in color of urine, no urgency or frequency.  No flank pain, no hematuria   MS:  No joint pain or swelling.  No decreased range of motion.    Psych:  No change in mood or affect. No depression or anxiety.  No memory loss.         Objective:   Physical Exam GEN: A/Ox3; pleasant , NAD, well nourished   HEENT:  [place]/AT,  EACs-clear, TMs-wnl, NOSE-clear drainage , THROAT-clear, no lesions, no postnasal drip or exudate noted.   NECK:  Supple w/ fair ROM; no JVD; normal carotid impulses w/o bruits; no thyromegaly or nodules palpated; no lymphadenopathy.  RESP  Coarse Rhonchi and few  trace wheezes on expiration , no accessory muscle use, no dullness to percussion  CARD:  RRR, no m/r/g  , no peripheral edema, pulses intact, no cyanosis or clubbing.  GI:   Soft & nt; nml bowel sounds; no organomegaly or masses detected.  Musco: Warm bil, no deformities or joint swelling noted.   Neuro: alert, no focal deficits noted.    Skin: Warm, no lesions or rashes        Assessment & Plan:

## 2012-10-19 ENCOUNTER — Encounter: Payer: Self-pay | Admitting: *Deleted
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
## 2012-11-12 ENCOUNTER — Other Ambulatory Visit: Payer: Self-pay | Admitting: Pulmonary Disease

## 2012-11-13 ENCOUNTER — Ambulatory Visit (INDEPENDENT_AMBULATORY_CARE_PROVIDER_SITE_OTHER): Payer: Medicare Other | Admitting: Pulmonary Disease

## 2012-11-13 ENCOUNTER — Encounter: Payer: Self-pay | Admitting: Pulmonary Disease

## 2012-11-13 VITALS — BP 158/64 | HR 70 | Temp 97.3°F | Ht 71.5 in | Wt 248.4 lb

## 2012-11-13 DIAGNOSIS — J449 Chronic obstructive pulmonary disease, unspecified: Secondary | ICD-10-CM

## 2012-11-13 MED ORDER — TIOTROPIUM BROMIDE MONOHYDRATE 18 MCG IN CAPS: 18.0000 ug | ORAL_CAPSULE | Freq: Every day | RESPIRATORY_TRACT | Status: DC

## 2012-11-13 NOTE — Assessment & Plan Note (Signed)
The patient has moderate COPD, but is having recurrent exacerbations because of ongoing smoking.  He is on a bronchodilator regimen, and I have told him that he will not stay well if he continues to smoke.  He is going to try and work on this.  I have also encouraged him to work on weight loss and some type of conditioning program.

## 2012-11-13 NOTE — Patient Instructions (Addendum)
Stay on current inhalers.  The key for you is total smoking cessation. Work on weight loss and conditioning. followup with me in 6mos.

## 2012-11-13 NOTE — Progress Notes (Signed)
  Subjective:    Patient ID: [name], male    DOB: 11/24/1940, 72 y.o.   MRN: [id number]  HPI The patient comes in today for followup of his known COPD, and unfortunately ongoing smoking.  He has had a recent episode of acute asthmatic bronchitis that was treated with antibiotics and prednisone, and he has nearly returned to baseline.  He had some scant hemoptysis at that time, but a chest x-ray showed no acute process.  He has coughed up no further blood.   Review of Systems  Constitutional: Negative for fever and unexpected weight change.  HENT: Positive for congestion, sore throat (x 1.5 week), rhinorrhea, postnasal drip and sinus pressure. Negative for ear pain, nosebleeds, sneezing, trouble swallowing and dental problem.   Eyes: Negative for redness and itching.  Respiratory: Positive for cough, shortness of breath and wheezing. Negative for chest tightness.   Cardiovascular: Negative for palpitations and leg swelling.  Gastrointestinal: Negative for nausea and vomiting.  Genitourinary: Negative for dysuria.  Musculoskeletal: Negative for joint swelling.  Skin: Negative for rash.  Neurological: Negative for headaches.  Hematological: Does not bruise/bleed easily.  Psychiatric/Behavioral: Negative for dysphoric mood. The patient is not nervous/anxious.        Objective:   Physical Exam Overweight male in no acute distress Nose without purulence or discharge noted Neck without lymphadenopathy or thyromegaly Chest with decreased breath sounds throughout, rhonchi bilaterally, but no wheezes. Cardiac exam with regular rate and rhythm Lower extremities without edema, no cyanosis Alert and oriented, moves all 4 extremities.       Assessment & Plan:

## 2013-03-06 ENCOUNTER — Other Ambulatory Visit: Payer: Self-pay | Admitting: Pulmonary Disease

## 2013-05-14 ENCOUNTER — Ambulatory Visit: Payer: Medicare Other | Admitting: Pulmonary Disease

## 2013-05-21 ENCOUNTER — Ambulatory Visit: Payer: Medicare Other | Admitting: Pulmonary Disease

## 2013-06-18 ENCOUNTER — Ambulatory Visit: Payer: Medicare Other | Admitting: Pulmonary Disease

## 2013-07-12 ENCOUNTER — Other Ambulatory Visit: Payer: Self-pay | Admitting: Pulmonary Disease

## 2013-08-14 ENCOUNTER — Ambulatory Visit: Payer: Medicare Other | Admitting: Pulmonary Disease

## 2013-09-17 ENCOUNTER — Ambulatory Visit (INDEPENDENT_AMBULATORY_CARE_PROVIDER_SITE_OTHER): Payer: Medicare Other | Admitting: Pulmonary Disease

## 2013-09-17 ENCOUNTER — Encounter: Payer: Self-pay | Admitting: Pulmonary Disease

## 2013-09-17 VITALS — BP 142/68 | HR 66 | Temp 98.0°F | Ht 71.5 in | Wt 244.6 lb

## 2013-09-17 DIAGNOSIS — J449 Chronic obstructive pulmonary disease, unspecified: Secondary | ICD-10-CM

## 2013-09-17 DIAGNOSIS — J4489 Other specified chronic obstructive pulmonary disease: Secondary | ICD-10-CM

## 2013-09-17 DIAGNOSIS — J441 Chronic obstructive pulmonary disease with (acute) exacerbation: Secondary | ICD-10-CM

## 2013-09-17 MED ORDER — PREDNISONE 10 MG PO TABS: ORAL_TABLET | ORAL | Status: DC

## 2013-09-17 NOTE — Assessment & Plan Note (Signed)
The patient's history is most consistent with an acute COPD exacerbation. He is having increased shortness of breath, wheezing on exam today, and increased congestion. He will need a course of prednisone to get him through this episode. He is to call us if he begins to cough up purulent mucus at that we can also treat him with a course of antibiotics. I have stressed to him again this is all caused by his ongoing smoking, and urged him to quit. He is also wanting to stop his oxygen, but agreed to evaluate his overnight oximetry prior to doing so.

## 2013-09-17 NOTE — Progress Notes (Signed)
   Subjective:    Patient ID: [name], male    DOB: [birth year], 72 y.o.   MRN: [id number]  HPI The patient comes in today for an acute sick visit. He has known COPD, and unfortunately continues to smoke. He gives a few day history of increasing chest congestion, as well as wheezing. He is not coughing up mucus or purulence. He is not sure if he has a chest infection brewing. He does have some increased shortness of breath.   Review of Systems  Constitutional: Negative for fever and unexpected weight change.  HENT: Positive for congestion, postnasal drip, rhinorrhea, sinus pressure and sneezing. Negative for dental problem, ear pain ( at times), nosebleeds, sore throat and trouble swallowing.   Eyes: Positive for itching. Negative for redness.  Respiratory: Positive for chest tightness, shortness of breath and wheezing. Negative for cough.        Hemoptysis at times--last time x 1 week ago  Cardiovascular: Negative for palpitations and leg swelling.  Gastrointestinal: Negative for nausea and vomiting.  Genitourinary: Negative for dysuria.  Musculoskeletal: Negative for joint swelling.  Skin: Negative for rash.  Neurological: Positive for headaches.  Hematological: Does not bruise/bleed easily.  Psychiatric/Behavioral: Negative for dysphoric mood. The patient is not nervous/anxious.        Objective:   Physical Exam Overweight male in no acute distress Nose without purulence or discharge noted Neck without lymphadenopathy or thyromegaly Chest with diffuse wheezes and rhonchi, but no crackles Cardiac exam with regular rate and rhythm Lower extremities with mild edema, no cyanosis Alert and oriented, moves all 4 extremities.       Assessment & Plan:

## 2013-09-17 NOTE — Patient Instructions (Signed)
Will treat with a course of prednisone over 8 days to help your breathing.  Please call if you begin to cough up nasty mucus Will check your oxygen level overnight OFF oxygen to see if you really need it.  Stop smoking.  This is key for you! No change in your daily breathing medications. followup with me in 6mos, but call if you are not improving.

## 2013-10-11 ENCOUNTER — Other Ambulatory Visit: Payer: Self-pay | Admitting: Pulmonary Disease

## 2013-10-25 ENCOUNTER — Telehealth: Payer: Self-pay | Admitting: Pulmonary Disease

## 2013-10-25 DIAGNOSIS — J449 Chronic obstructive pulmonary disease, unspecified: Secondary | ICD-10-CM

## 2013-10-25 NOTE — Telephone Encounter (Signed)
Called, spoke with pt. Spoke with his son per his request - informed him of below per [facility].  They are ok with o2 being d/c'ed and is aware [facility] will call them to schedule this. Mandy with [facility] aware order has been placed.  Nothing further needed.

## 2013-10-25 NOTE — Telephone Encounter (Signed)
ONO results have been received and placed in [facility] green folder for review. Please advise, thanks.   

## 2013-10-25 NOTE — Telephone Encounter (Signed)
I spoke with [facility].She reports she just faxed ONO over to triage fax # and she also faxed this to upfront fax #. Pt is wanting to know if he can d/c the O2. Will forward to Ashtyn to f/u on.

## 2013-10-25 NOTE — Telephone Encounter (Signed)
Let pt know that his oxygen level drops to 81% at night, but only spends 6 minutes the entire night less than 89% Ok to discontinue oxygen.

## 2013-11-12 ENCOUNTER — Telehealth (HOSPITAL_COMMUNITY): Payer: Self-pay | Admitting: *Deleted

## 2013-12-10 ENCOUNTER — Encounter: Payer: Self-pay | Admitting: Pulmonary Disease

## 2014-01-15 ENCOUNTER — Other Ambulatory Visit: Payer: Self-pay

## 2014-01-15 MED ORDER — NITROGLYCERIN 0.4 MG SL SUBL: 0.4000 mg | SUBLINGUAL_TABLET | SUBLINGUAL | Status: AC | PRN

## 2014-01-15 NOTE — Telephone Encounter (Signed)
Rx was sent to pharmacy electronically. Patient has not been seen since 12/02/2011. Pharmacy notified patient needs an apppointment.

## 2014-02-07 ENCOUNTER — Other Ambulatory Visit: Payer: Self-pay | Admitting: Pulmonary Disease

## 2014-02-11 ENCOUNTER — Telehealth: Payer: Self-pay | Admitting: Pulmonary Disease

## 2014-02-11 MED ORDER — TIOTROPIUM BROMIDE MONOHYDRATE 18 MCG IN CAPS: 18.0000 ug | ORAL_CAPSULE | Freq: Every day | RESPIRATORY_TRACT | Status: DC

## 2014-02-11 MED ORDER — ALBUTEROL SULFATE HFA 108 (90 BASE) MCG/ACT IN AERS: 2.0000 | INHALATION_SPRAY | RESPIRATORY_TRACT | Status: DC | PRN

## 2014-02-11 NOTE — Telephone Encounter (Signed)
Spoke with pt. Verified what form of albuterol he needed. Both rx's have been sent in. Pt is aware.

## 2014-02-13 ENCOUNTER — Telehealth: Payer: Self-pay | Admitting: Pulmonary Disease

## 2014-02-13 MED ORDER — ALBUTEROL SULFATE HFA 108 (90 BASE) MCG/ACT IN AERS: 2.0000 | INHALATION_SPRAY | RESPIRATORY_TRACT | Status: AC | PRN

## 2014-02-13 NOTE — Telephone Encounter (Signed)
[facility], spoke with pharmacist Adonis Brook to inform her that pt's Proventil HFA is being changed to Proair HFA 2 puffs q4h prn wheezing/shortness of breath. Adonis Brook verbalized her understanding.  Called spoke with patient, informed him of the above.  Pt verbalized his understanding.  Med list updated.  Nothing further needed; will sign off.

## 2014-02-13 NOTE — Telephone Encounter (Signed)
Called for PA and was told the alternatives for Proventil HFA are ProAir HFA, Ventolin HFA, and Xopenex HFA; the cheapest inhalers are Proair and Ventolin and they are on Tier 3. Ethel please advise which you would rather change patient to. Thanks.

## 2014-02-13 NOTE — Telephone Encounter (Signed)
Change to proair

## 2014-03-18 ENCOUNTER — Ambulatory Visit: Payer: Medicare Other | Admitting: Pulmonary Disease

## 2014-04-15 ENCOUNTER — Ambulatory Visit (INDEPENDENT_AMBULATORY_CARE_PROVIDER_SITE_OTHER): Payer: Medicare HMO | Admitting: Pulmonary Disease

## 2014-04-15 ENCOUNTER — Encounter: Payer: Self-pay | Admitting: Pulmonary Disease

## 2014-04-15 VITALS — BP 140/70 | HR 59 | Temp 98.2°F | Ht 71.5 in | Wt 242.4 lb

## 2014-04-15 DIAGNOSIS — J438 Other emphysema: Secondary | ICD-10-CM

## 2014-04-15 DIAGNOSIS — J441 Chronic obstructive pulmonary disease with (acute) exacerbation: Secondary | ICD-10-CM

## 2014-04-15 DIAGNOSIS — J439 Emphysema, unspecified: Secondary | ICD-10-CM

## 2014-04-15 MED ORDER — PREDNISONE 10 MG PO TABS: ORAL_TABLET | ORAL | Status: DC

## 2014-04-15 NOTE — Progress Notes (Signed)
   Subjective:    Patient ID: [name], male    DOB: 1941-08-28, 72 y.o.   MRN: [id number]  HPI The patient comes in today for his followup, but he is also having ongoing issues after a recent acute exacerbation. He was treated by his primary with a round of prednisone, and although he is much improved, he has not returned to his baseline. He is still having some cough with thick white mucus, as well as wheezing. He also tells me his primary is concerned about congestive heart failure, but he is not sure he can afford to go see a cardiologist. Unfortunately, he continues to smoke despite my encouragement to stop.   Review of Systems  Constitutional: Negative for fever and unexpected weight change.  HENT: Negative for congestion, dental problem, ear pain, nosebleeds, postnasal drip, rhinorrhea, sinus pressure, sneezing, sore throat and trouble swallowing.   Eyes: Negative for redness and itching.  Respiratory: Positive for shortness of breath. Negative for cough, chest tightness and wheezing.   Cardiovascular: Negative for palpitations and leg swelling.  Gastrointestinal: Negative for nausea and vomiting.  Genitourinary: Negative for dysuria.  Musculoskeletal: Negative for joint swelling.  Skin: Negative for rash.  Neurological: Positive for dizziness. Negative for headaches.  Hematological: Does not bruise/bleed easily.  Psychiatric/Behavioral: Negative for dysphoric mood. The patient is not nervous/anxious.        Objective:   Physical Exam Overweight male in no acute distress Nose without purulence or discharge noted Neck without lymphadenopathy or thyromegaly Chest with decreased breath sounds and scattered wheezes throughout Cardiac exam with regular rate and rhythm Lower extremities with mild edema, no cyanosis Alert and oriented, moves all 4 extremities appear        Assessment & Plan:

## 2014-04-15 NOTE — Assessment & Plan Note (Signed)
Patient has had a recent acute exacerbation treated with a short course of prednisone, but is continuing to have bronchospasm on exam and cough with mucus. I think he needs to be treated with another round of prednisone, but more importantly I have stressed to him that he needs to stop smoking 100%. I have explained that his acute exacerbations will continue it until he is able to stop smoking. He is already on a very good maintenance bronchodilator regimen, and I've asked him to continue this.

## 2014-04-15 NOTE — Patient Instructions (Signed)
Will treat with a 6 day course of prednisone to get you back to baseline. No change in your breathing medications. You really need to stop smoking if you want to avoid your flareups.  followup with me again in 86mos.

## 2014-04-28 ENCOUNTER — Telehealth: Payer: Self-pay | Admitting: Pulmonary Disease

## 2014-04-28 MED ORDER — BUDESONIDE-FORMOTEROL FUMARATE 160-4.5 MCG/ACT IN AERO: 2.0000 | INHALATION_SPRAY | Freq: Two times a day (BID) | RESPIRATORY_TRACT | Status: AC

## 2014-04-28 NOTE — Telephone Encounter (Signed)
I have refilled the symbicort  LMOM for the pt to be made aware

## 2014-07-25 ENCOUNTER — Other Ambulatory Visit: Payer: Self-pay | Admitting: Pulmonary Disease

## 2014-08-03 ENCOUNTER — Observation Stay (HOSPITAL_COMMUNITY)
Admission: EM | Admit: 2014-08-03 | Discharge: 2014-08-04 | Disposition: A | Discharge status: Home / Self Care | Payer: Medicare HMO | Attending: Cardiovascular Disease | Admitting: Cardiovascular Disease

## 2014-08-03 ENCOUNTER — Emergency Department (HOSPITAL_COMMUNITY): Payer: Medicare HMO

## 2014-08-03 ENCOUNTER — Encounter (HOSPITAL_COMMUNITY): Payer: Self-pay | Admitting: Emergency Medicine

## 2014-08-03 ENCOUNTER — Other Ambulatory Visit: Payer: Self-pay

## 2014-08-03 DIAGNOSIS — Z79899 Other long term (current) drug therapy: Secondary | ICD-10-CM | POA: Diagnosis not present

## 2014-08-03 DIAGNOSIS — Z9861 Coronary angioplasty status: Secondary | ICD-10-CM | POA: Insufficient documentation

## 2014-08-03 DIAGNOSIS — Z85828 Personal history of other malignant neoplasm of skin: Secondary | ICD-10-CM | POA: Diagnosis not present

## 2014-08-03 DIAGNOSIS — I252 Old myocardial infarction: Secondary | ICD-10-CM | POA: Diagnosis not present

## 2014-08-03 DIAGNOSIS — I2510 Atherosclerotic heart disease of native coronary artery without angina pectoris: Secondary | ICD-10-CM | POA: Diagnosis present

## 2014-08-03 DIAGNOSIS — Z8701 Personal history of pneumonia (recurrent): Secondary | ICD-10-CM | POA: Diagnosis not present

## 2014-08-03 DIAGNOSIS — H9190 Unspecified hearing loss, unspecified ear: Secondary | ICD-10-CM | POA: Diagnosis not present

## 2014-08-03 DIAGNOSIS — I10 Essential (primary) hypertension: Secondary | ICD-10-CM | POA: Diagnosis not present

## 2014-08-03 DIAGNOSIS — R079 Chest pain, unspecified: Secondary | ICD-10-CM | POA: Diagnosis present

## 2014-08-03 DIAGNOSIS — Z716 Tobacco abuse counseling: Secondary | ICD-10-CM

## 2014-08-03 DIAGNOSIS — Z720 Tobacco use: Secondary | ICD-10-CM | POA: Diagnosis not present

## 2014-08-03 DIAGNOSIS — Z87442 Personal history of urinary calculi: Secondary | ICD-10-CM | POA: Diagnosis not present

## 2014-08-03 DIAGNOSIS — E785 Hyperlipidemia, unspecified: Secondary | ICD-10-CM | POA: Diagnosis not present

## 2014-08-03 DIAGNOSIS — I739 Peripheral vascular disease, unspecified: Secondary | ICD-10-CM

## 2014-08-03 DIAGNOSIS — M1990 Unspecified osteoarthritis, unspecified site: Secondary | ICD-10-CM | POA: Diagnosis not present

## 2014-08-03 DIAGNOSIS — I6523 Occlusion and stenosis of bilateral carotid arteries: Secondary | ICD-10-CM

## 2014-08-03 DIAGNOSIS — R072 Precordial pain: Secondary | ICD-10-CM

## 2014-08-03 DIAGNOSIS — Z7902 Long term (current) use of antithrombotics/antiplatelets: Secondary | ICD-10-CM | POA: Diagnosis not present

## 2014-08-03 DIAGNOSIS — R0789 Other chest pain: Secondary | ICD-10-CM

## 2014-08-03 DIAGNOSIS — J449 Chronic obstructive pulmonary disease, unspecified: Secondary | ICD-10-CM

## 2014-08-03 DIAGNOSIS — Z8719 Personal history of other diseases of the digestive system: Secondary | ICD-10-CM | POA: Diagnosis not present

## 2014-08-03 DIAGNOSIS — I25118 Atherosclerotic heart disease of native coronary artery with other forms of angina pectoris: Secondary | ICD-10-CM

## 2014-08-03 DIAGNOSIS — G8929 Other chronic pain: Secondary | ICD-10-CM | POA: Diagnosis not present

## 2014-08-03 LAB — CBC WITH DIFFERENTIAL/PLATELET
BASOS ABS: 0.1 10*3/uL (ref 0.0–0.1)
BASOS PCT: 1 % (ref 0–1)
EOS ABS: 0.4 10*3/uL (ref 0.0–0.7)
Eosinophils Relative: 5 % (ref 0–5)
HCT: 42 % (ref 39.0–52.0)
HEMOGLOBIN: 14.3 g/dL (ref 13.0–17.0)
Lymphocytes Relative: 24 % (ref 12–46)
Lymphs Abs: 2 10*3/uL (ref 0.7–4.0)
MCH: 31.6 pg (ref 26.0–34.0)
MCHC: 34 g/dL (ref 30.0–36.0)
MCV: 92.7 fL (ref 78.0–100.0)
MONO ABS: 0.7 10*3/uL (ref 0.1–1.0)
MONOS PCT: 8 % (ref 3–12)
NEUTROS ABS: 5.1 10*3/uL (ref 1.7–7.7)
NEUTROS PCT: 62 % (ref 43–77)
Platelets: 194 10*3/uL (ref 150–400)
RBC: 4.53 MIL/uL (ref 4.22–5.81)
RDW: 14 % (ref 11.5–15.5)
WBC: 8.2 10*3/uL (ref 4.0–10.5)

## 2014-08-03 LAB — I-STAT TROPONIN, ED: TROPONIN I, POC: 0 ng/mL (ref 0.00–0.08)

## 2014-08-03 LAB — I-STAT CHEM 8, ED
BUN: 13 mg/dL (ref 6–23)
CHLORIDE: 105 meq/L (ref 96–112)
Calcium, Ion: 1.2 mmol/L (ref 1.13–1.30)
Creatinine, Ser: 1.1 mg/dL (ref 0.50–1.35)
Glucose, Bld: 117 mg/dL — ABNORMAL HIGH (ref 70–99)
HEMATOCRIT: 45 % (ref 39.0–52.0)
Hemoglobin: 15.3 g/dL (ref 13.0–17.0)
Potassium: 4.8 mEq/L (ref 3.7–5.3)
Sodium: 140 mEq/L (ref 137–147)
TCO2: 26 mmol/L (ref 0–100)

## 2014-08-03 LAB — URINALYSIS, ROUTINE W REFLEX MICROSCOPIC
Glucose, UA: NEGATIVE mg/dL
Hgb urine dipstick: NEGATIVE
Ketones, ur: NEGATIVE mg/dL
Leukocytes, UA: NEGATIVE
NITRITE: NEGATIVE
PROTEIN: NEGATIVE mg/dL
SPECIFIC GRAVITY, URINE: 1.024 (ref 1.005–1.030)
UROBILINOGEN UA: 1 mg/dL (ref 0.0–1.0)
pH: 5.5 (ref 5.0–8.0)

## 2014-08-03 LAB — TROPONIN I: Troponin I: <0.3 ng/mL (ref <0.30)

## 2014-08-03 MED ORDER — TRAZODONE HCL 50 MG PO TABS
50.0000 mg | ORAL_TABLET | Freq: Every evening | ORAL | Status: DC | PRN
  Administered 2014-08-03: 50 mg via ORAL
  Filled 2014-08-03 (×2): qty 1

## 2014-08-03 MED ORDER — ALBUTEROL SULFATE (2.5 MG/3ML) 0.083% IN NEBU: 2.5000 mg | INHALATION_SOLUTION | RESPIRATORY_TRACT | Status: DC | PRN

## 2014-08-03 MED ORDER — NITROGLYCERIN IN D5W 200-5 MCG/ML-% IV SOLN
0.0000 ug/min | Freq: Once | INTRAVENOUS | Status: AC
  Administered 2014-08-03: 5 ug/min via INTRAVENOUS
  Filled 2014-08-03: qty 250

## 2014-08-03 MED ORDER — METOPROLOL SUCCINATE ER 25 MG PO TB24
25.0000 mg | ORAL_TABLET | Freq: Every day | ORAL | Status: DC
  Administered 2014-08-04: 25 mg via ORAL
  Filled 2014-08-03: qty 1

## 2014-08-03 MED ORDER — ACETAMINOPHEN 325 MG PO TABS
650.0000 mg | ORAL_TABLET | ORAL | Status: DC | PRN
  Administered 2014-08-04: 650 mg via ORAL
  Filled 2014-08-03: qty 2

## 2014-08-03 MED ORDER — NITROGLYCERIN IN D5W 200-5 MCG/ML-% IV SOLN: 0.0000 ug/min | Freq: Once | INTRAVENOUS | Status: DC

## 2014-08-03 MED ORDER — SIMVASTATIN 40 MG PO TABS
40.0000 mg | ORAL_TABLET | Freq: Every day | ORAL | Status: DC
  Administered 2014-08-03: 40 mg via ORAL
  Filled 2014-08-03: qty 1

## 2014-08-03 MED ORDER — ONDANSETRON HCL 4 MG/2ML IJ SOLN: 4.0000 mg | Freq: Four times a day (QID) | INTRAMUSCULAR | Status: DC | PRN

## 2014-08-03 MED ORDER — PANTOPRAZOLE SODIUM 40 MG PO TBEC
40.0000 mg | DELAYED_RELEASE_TABLET | Freq: Every day | ORAL | Status: DC
  Administered 2014-08-03 – 2014-08-04 (×2): 40 mg via ORAL
  Filled 2014-08-03 (×2): qty 1

## 2014-08-03 MED ORDER — BUDESONIDE-FORMOTEROL FUMARATE 160-4.5 MCG/ACT IN AERO
2.0000 | INHALATION_SPRAY | Freq: Two times a day (BID) | RESPIRATORY_TRACT | Status: DC
Start: 2014-08-03 — End: 2014-08-04
  Administered 2014-08-03: 2 via RESPIRATORY_TRACT
  Filled 2014-08-03: qty 6

## 2014-08-03 MED ORDER — TAMSULOSIN HCL 0.4 MG PO CAPS
0.4000 mg | ORAL_CAPSULE | Freq: Every day | ORAL | Status: DC
  Administered 2014-08-03 – 2014-08-04 (×2): 0.4 mg via ORAL
  Filled 2014-08-03 (×2): qty 1

## 2014-08-03 MED ORDER — ALBUTEROL SULFATE HFA 108 (90 BASE) MCG/ACT IN AERS: 2.0000 | INHALATION_SPRAY | RESPIRATORY_TRACT | Status: DC | PRN

## 2014-08-03 MED ORDER — CLOPIDOGREL BISULFATE 75 MG PO TABS
75.0000 mg | ORAL_TABLET | Freq: Every day | ORAL | Status: DC
  Administered 2014-08-04: 75 mg via ORAL
  Filled 2014-08-03: qty 1

## 2014-08-03 MED ORDER — TIOTROPIUM BROMIDE MONOHYDRATE 18 MCG IN CAPS
18.0000 ug | ORAL_CAPSULE | Freq: Every day | RESPIRATORY_TRACT | Status: DC
  Filled 2014-08-03: qty 5

## 2014-08-03 MED ORDER — NITROGLYCERIN 0.4 MG SL SUBL: 0.4000 mg | SUBLINGUAL_TABLET | SUBLINGUAL | Status: DC | PRN

## 2014-08-03 NOTE — ED Notes (Signed)
Patient transported to X-ray 

## 2014-08-03 NOTE — ED Notes (Signed)
Son gone home. Will call in the morning

## 2014-08-03 NOTE — H&P (Signed)
Physician History and Physical    [name] MRN: [id number] DOB/AGE: [birth year] 73 y.o. Admit date: 08/03/2014   Primary Cardiologist: Uncertain name, located in [place] Admitting Cardiologist: Kate Sable  HPI: The patient is a 73 yr old male with a h/o CAD with prior PCI of LAD and LCx, peripheral vascular disease with prior intervention of the left common iliac, AAA, carotid artery disease, HTN, hyperlipidemia, COPD (followed by Dr. Gwenette Greet), and tobacco abuse. He had been lifting 10 lb weights this morning, and suddenly developed a "sharp, cramping, severe pain" located on the left side of his thorax, which radiated into his left armpit and then across his chest to the mid-sternal region. He had worsening of his baseline shortness of breath. He also felt lightheaded and nauseous, and thought he was going to pass out. He took 1 SL nitro and put on his oxygen which he normally wears at night, and called 911. In the ED, his pain waxed and waned until he was started on IV nitro 5 mcg/min. He still has some nausea but has not vomited.  CXR showed emphysema but no acute abnormalities. ECG demonstrated sinus bradycardia with no acute ischemic ST-T abnormalities. Initial POC troponin is normal.  He last saw his cardiologist 3-4 months ago in [place]. He does not think he's had a stress test in 5-6 years. Thinks he had vascular testing performed at last cardiology visit, but not echocardiogram. Says he has a history of pleurisy which flares "every now and then".  He does not take ASA due to prior h/o nosebleeds and GI ulcers. Takes a half-tablet of Plavix daily and simvastatin.  Soc: Smokes 1.5 ppd since age 24. 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 Widower. Has a son who is present.  Fam: Noncontributory.  Review of systems complete and found to be negative unless listed above   No family history of premature CAD in 1st degree relatives. History   Social History  .  Marital Status: Married    Spouse Name: N/A    Number of Children: N/A  . Years of Education: N/A   Occupational History  . Not on file.   Social History Main Topics  . Smoking status: Current Every Day Smoker -- 2.00 packs/day for 55 years    Types: Cigarettes  . Smokeless tobacco: Not on file     Comment: currently smoking 1 ppd.   . Alcohol Use: No     Comment: 12/13/11 "quit drinking ~ 1995"  . Drug Use: No  . Sexual Activity: Not Currently   Other Topics Concern  . Not on file   Social History Narrative  . No narrative on file      Physical Exam: Blood pressure 133/59, pulse 56, temperature 98.6 F (37 C), temperature source Oral, resp. rate 16, height 5\' 11"  (1.803 m), weight 235 lb (106.595 kg), SpO2 98.00%.  General: NAD, obese Neck: No JVD, no thyromegaly or thyroid nodule.  Lungs: Prolonged expiratory phase, inspiratory and expiratory wheezes, bilateral rhonchi, no crackles. CV: Nondisplaced PMI.  Heart regular S1/S2, no S3/S4, no murmur.  No peripheral edema.  Bilateral carotid bruits.  Diminished pedal pulses.  Abdomen: Soft, obese, nontender, no organomegaly, no distention.  Skin: Intact without lesions or rashes.  Neurologic: Alert and oriented.  Psych: Normal affect. Extremities: No clubbing or cyanosis.  HEENT: Normal.   Labs:   Lab Results  Component Value Date   WBC 8.2 08/03/2014   HGB 15.3 08/03/2014  HCT 45.0 08/03/2014   MCV 92.7 08/03/2014   PLT 194 08/03/2014    Recent Labs Lab 08/03/14 1350  NA 140  K 4.8  CL 105  BUN 13  CREATININE 1.10  GLUCOSE 117*   Lab Results  Component Value Date   CKTOTAL 578* 09/10/2011   CKMB 3.9 09/10/2011   TROPONINI <0.30 09/10/2011    No results found for this basename: CHOL   No results found for this basename: HDL   No results found for this basename: LDLCALC   No results found for this basename: TRIG   No results found for this basename: CHOLHDL   No results found for this basename:  LDLDIRECT         ASSESSMENT AND PLAN:  1. Chest pain in the context of CAD with prior PCI: Given his history and ongoing tobacco abuse, will admit and rule out for an acute coronary syndrome. Will check serial serum troponins. Obtain echocardiogram to assess LV function. Will attempt to obtain records from [place] to see what additional cardiovascular testing he has had within the past year. If troponins are normal, would plan on proceeding with a Lexiscan (or dobutamine given significant COPD) Cardiolite stress test to evaluate for ischemia, particularly in the LAD and LCx region as that is where prior PCI was performed. Continue IV nitroglycerin infusion. Will continue Plavix at 75 mg daily. No ASA as per patient preference. Continue Toprol-XL and simvastatin.  2. Essential HTN: He is on both Toprol-XL and bisoprolol. Would continue Toprol-XL and perhaps start lisinopril for BP control if it were to become elevated. SBP presently in 140 mmHg range on nitro drip.  3. Hyperlipidemia: Continue simvastatin. He is uncertain of dose. I will start 40 mg. Given his history of CAD and PVD, would favor Lipitor 80 mg. Check outside records to see if lipids were recently evaluated.  4. PVD: Has bilateral carotid bruits and diminished pedal pulses. Obtain outside cardiology records to see what noninvasive vascular studies were performed earlier this year.  5. Tobacco abuse: Cessation counseling given. He says he needs to quit, but I am not convinced he will.  6. COPD: Stable. Continue home meds/inhalers.  Signed: Kate Sable, M.D., F.A.C.C. 08/03/2014, 4:37 PM

## 2014-08-03 NOTE — ED Notes (Signed)
Returned from xray
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
## 2014-08-03 NOTE — ED Notes (Signed)
Patient c/o sudden onset substernal chest c/o nausea and weakness, states he took 2 ntg with relief. Also took 2 baby asa PTA.currently c/o pain left lateral chest pain worse with inspiration. Sister Lovey Newcomer was notifed by EMs [phone].

## 2014-08-03 NOTE — ED Notes (Signed)
Called to check on status of food tray, tray is on the way.

## 2014-08-04 ENCOUNTER — Observation Stay (HOSPITAL_COMMUNITY): Payer: Medicare HMO

## 2014-08-04 ENCOUNTER — Encounter (HOSPITAL_COMMUNITY): Payer: Self-pay | Admitting: Nurse Practitioner

## 2014-08-04 DIAGNOSIS — R079 Chest pain, unspecified: Secondary | ICD-10-CM

## 2014-08-04 DIAGNOSIS — I2510 Atherosclerotic heart disease of native coronary artery without angina pectoris: Secondary | ICD-10-CM | POA: Diagnosis present

## 2014-08-04 DIAGNOSIS — I10 Essential (primary) hypertension: Secondary | ICD-10-CM | POA: Diagnosis present

## 2014-08-04 DIAGNOSIS — R0789 Other chest pain: Secondary | ICD-10-CM

## 2014-08-04 DIAGNOSIS — R072 Precordial pain: Secondary | ICD-10-CM

## 2014-08-04 DIAGNOSIS — E785 Hyperlipidemia, unspecified: Secondary | ICD-10-CM | POA: Diagnosis present

## 2014-08-04 LAB — LIPID PANEL
CHOLESTEROL: 200 mg/dL (ref 0–200)
HDL: 26 mg/dL — ABNORMAL LOW (ref >39)
LDL Cholesterol: 143 mg/dL — ABNORMAL HIGH (ref 0–99)
Total CHOL/HDL Ratio: 7.7 RATIO
Triglycerides: 154 mg/dL — ABNORMAL HIGH (ref <150)
VLDL: 31 mg/dL (ref 0–40)

## 2014-08-04 LAB — HEMOGLOBIN A1C
HEMOGLOBIN A1C: 6.3 % — AB (ref <5.7)
Mean Plasma Glucose: 134 mg/dL — ABNORMAL HIGH (ref <117)

## 2014-08-04 LAB — TSH: TSH: 3.62 u[IU]/mL (ref 0.350–4.500)

## 2014-08-04 LAB — BASIC METABOLIC PANEL
Anion gap: 10 (ref 5–15)
BUN: 12 mg/dL (ref 6–23)
CALCIUM: 9.2 mg/dL (ref 8.4–10.5)
CO2: 26 mEq/L (ref 19–32)
Chloride: 106 mEq/L (ref 96–112)
Creatinine, Ser: 1.02 mg/dL (ref 0.50–1.35)
GFR, EST AFRICAN AMERICAN: 82 mL/min — AB (ref >90)
GFR, EST NON AFRICAN AMERICAN: 70 mL/min — AB (ref >90)
Glucose, Bld: 125 mg/dL — ABNORMAL HIGH (ref 70–99)
Potassium: 4.4 mEq/L (ref 3.7–5.3)
SODIUM: 142 meq/L (ref 137–147)

## 2014-08-04 LAB — MRSA PCR SCREENING: MRSA by PCR: NEGATIVE

## 2014-08-04 LAB — TROPONIN I
Troponin I: <0.3 ng/mL (ref <0.30)
Troponin I: <0.3 ng/mL (ref <0.30)

## 2014-08-04 MED ORDER — AMLODIPINE BESYLATE 5 MG PO TABS: 5.0000 mg | ORAL_TABLET | Freq: Every day | ORAL | Status: AC

## 2014-08-04 MED ORDER — TECHNETIUM TC 99M SESTAMIBI - CARDIOLITE
30.0000 | Freq: Once | INTRAVENOUS | Status: AC | PRN
  Administered 2014-08-04: 30 via INTRAVENOUS

## 2014-08-04 MED ORDER — REGADENOSON 0.4 MG/5ML IV SOLN
INTRAVENOUS | Status: AC
  Administered 2014-08-04: 0.4 mg
  Filled 2014-08-04: qty 5

## 2014-08-04 MED ORDER — ATORVASTATIN CALCIUM 20 MG PO TABS: 20.0000 mg | ORAL_TABLET | Freq: Every day | ORAL | Status: DC

## 2014-08-04 MED ORDER — SIMVASTATIN 40 MG PO TABS: 40.0000 mg | ORAL_TABLET | Freq: Every day | ORAL | Status: AC

## 2014-08-04 MED ORDER — AMLODIPINE BESYLATE 5 MG PO TABS
5.0000 mg | ORAL_TABLET | Freq: Every day | ORAL | Status: DC
  Administered 2014-08-04: 5 mg via ORAL
  Filled 2014-08-04: qty 1

## 2014-08-04 MED ORDER — TECHNETIUM TC 99M SESTAMIBI - CARDIOLITE
10.0000 | Freq: Once | INTRAVENOUS | Status: AC | PRN
  Administered 2014-08-04: 09:00:00 10 via INTRAVENOUS

## 2014-08-04 NOTE — Discharge Summary (Signed)
See full note today. cdm

## 2014-08-04 NOTE — Discharge Instructions (Signed)
***  PLEASE REMEMBER TO BRING ALL OF YOUR MEDICATIONS TO EACH OF YOUR FOLLOW-UP OFFICE VISITS.  

## 2014-08-04 NOTE — Discharge Summary (Signed)
Discharge Summary   Patient ID: GRAVES NIPP,  MRN: [id number], DOB/AGE: 05-23-1941 73 y.o.  Admit date: 08/03/2014 Discharge date: 08/04/2014  Primary Care Provider: PROVIDER NOT IN SYSTEM Primary Cardiologist: [facility] (pt unsure of name)  Discharge Diagnoses Principal Problem:   Midsternal chest pain  ** Negative Lexiscan Cardiolite this admission.  Active Problems:   Coronary artery disease   COPD (chronic obstructive pulmonary disease)   Hypertension   Peripheral vascular disease   H/O AAA   Dyslipidemia   Allergies Allergies  Allergen Reactions  . Niacin Rash   Procedures  Lexiscan Cardiolite  IMPRESSION: 1. No reversible ischemia or infarction. 2. Normal left ventricular wall motion. 3. Left ventricular ejection fraction 63% 4. Low-risk stress test findings*. _____________  History of Present Illness  73 y/o male with the above complex problem list. He has a h/o CAD s/p prior PCI's of the LAD and LCX.  He also has a h/o PAD s/p LCIA intervention, HTN, HL, tobacco abuse, COPD, and AAA.  He was in his usual state of health until the morning of 10/25 when he developed sharp/cramping left sided chest pain while lifting 10 lb weights at home.  This was associated with lightheadedness, nausea, and presyncope.  He called EMS and was taken to the [facility] ED.  There, he4 was treated with IV nitroglycerin with resolution of c/p.  ECG was non-acute and troponin was normal.  He was admitted for further evaluation.  Hospital Course  Patient ruled out for MI and had no further chest pain.  Given his history, decision was made to pursue a lexiscan cardiolite, which was performed this AM and showed no evidence of ischemia or infarct with normal LV function.  As a result, he will be discharged home today in good condition.  Discharge Vitals Blood pressure 140/53, pulse 62, temperature 97.6 F (36.4 C), temperature source Oral, resp. rate 18, height 5\' 11"  (1.803 m), weight 235 lb  3.2 oz (106.686 kg), SpO2 96.00%.  Filed Weights   08/03/14 1240 08/03/14 2130  Weight: 235 lb (106.595 kg) 235 lb 3.2 oz (106.686 kg)    Labs  CBC  Recent Labs  08/03/14 1334 08/03/14 1350  WBC 8.2  --   NEUTROABS 5.1  --   HGB 14.3 15.3  HCT 42.0 45.0  MCV 92.7  --   PLT 194  --    Basic Metabolic Panel  Recent Labs  08/03/14 1350 08/04/14 0501  NA 140 142  K 4.8 4.4  CL 105 106  CO2  --  26  GLUCOSE 117* 125*  BUN 13 12  CREATININE 1.10 1.02  CALCIUM  --  9.2   Cardiac Enzymes  Recent Labs  08/03/14 1809 08/03/14 2310 08/04/14 0501  TROPONINI <0.30 <0.30 <0.30   Hemoglobin A1C  Recent Labs  08/03/14 2310  HGBA1C 6.3*   Fasting Lipid Panel  Recent Labs  08/04/14 0501  CHOL 200  HDL 26*  LDLCALC 143*  TRIG 154*  CHOLHDL 7.7   Thyroid Function Tests  Recent Labs  08/03/14 2310  TSH 3.620    Disposition  Pt is being discharged home today in good condition.  Follow-up Plans & Appointments      Follow-up Information   Follow up with Primary Cardiologist. (Follow-up with your primary cardiologist within the next 2 wks.)      Discharge Medications    Medication List    STOP taking these medications       bisoprolol 5 MG tablet  Commonly known as:  ZEBETA      TAKE these medications       albuterol (2.5 MG/3ML) 0.083% nebulizer solution  Commonly known as:  PROVENTIL  USE ONE VIAL IN NEBULIZER EVERY 6 HOURS AS NEEDED FOR WHEEZING     albuterol 108 (90 BASE) MCG/ACT inhaler  Commonly known as:  PROAIR HFA  Inhale 2 puffs into the lungs every 4 (four) hours as needed for wheezing or shortness of breath.     amLODipine 5 MG tablet  Commonly known as:  NORVASC  Take 1 tablet (5 mg total) by mouth daily.     budesonide-formoterol 160-4.5 MCG/ACT inhaler  Commonly known as:  SYMBICORT  Inhale 2 puffs into the lungs 2 (two) times daily.     clopidogrel 75 MG tablet  Commonly known as:  PLAVIX  Take 37.5 mg by mouth  daily.     metoprolol succinate 50 MG 24 hr tablet  Commonly known as:  TOPROL-XL  Take 25 mg by mouth daily.     nitroGLYCERIN 0.4 MG SL tablet  Commonly known as:  NITROSTAT  Place 1 tablet (0.4 mg total) under the tongue every 5 (five) minutes as needed for chest pain.     pantoprazole 40 MG tablet  Commonly known as:  PROTONIX  Take 40 mg by mouth daily.     simvastatin 40 MG tablet  Commonly known as:  ZOCOR  Take 1 tablet (40 mg total) by mouth daily at 6 PM.     SPIRIVA HANDIHALER 18 MCG inhalation capsule  Generic drug:  tiotropium  INHALE ONE DOSE INTO INHALER ONCE DAILY     tamsulosin 0.4 MG Caps capsule  Commonly known as:  FLOMAX  Take 1 capsule by mouth daily.     traZODone 50 MG tablet  Commonly known as:  DESYREL  Take 1 tablet by mouth at bedtime as needed.        Outstanding Labs/Studies  None  Duration of Discharge Encounter   Greater than 30 minutes including physician time.  Signed, Murray Hodgkins NP 08/04/2014, 4:44 PM

## 2014-08-04 NOTE — Progress Notes (Signed)
UR completed 

## 2014-08-04 NOTE — Progress Notes (Signed)
Pt discharged, ambulatory, accompanied by son, condition stable.

## 2014-08-04 NOTE — Progress Notes (Signed)
Patient Name: [name] Date of Encounter: 08/04/2014     Principal Problem:   Midsternal chest pain Active Problems:   Coronary artery disease   COPD (chronic obstructive pulmonary disease)   Hypertension   Peripheral vascular disease   Dyslipidemia   SUBJECTIVE  No further c/p.  CE neg.  For MV today.  CURRENT MEDS . budesonide-formoterol  2 puff Inhalation BID  . clopidogrel  75 mg Oral Daily  . metoprolol succinate  25 mg Oral Daily  . nitroGLYCERIN  0-200 mcg/min Intravenous Once  . pantoprazole  40 mg Oral Daily  . simvastatin  40 mg Oral q1800  . tamsulosin  0.4 mg Oral Daily  . tiotropium  18 mcg Inhalation Daily    OBJECTIVE  Filed Vitals:   08/04/14 0947 08/04/14 1023 08/04/14 1025 08/04/14 1027  BP: 161/66 181/55 174/59 166/60  Pulse: 56 95 75 69  Temp:      TempSrc:      Resp: 16     Height:      Weight:      SpO2:       No intake or output data in the 24 hours ending 08/04/14 1149 Filed Weights   08/03/14 1240 08/03/14 2130  Weight: 235 lb (106.595 kg) 235 lb 3.2 oz (106.686 kg)    PHYSICAL EXAM  General: Pleasant, NAD. Neuro: Alert and oriented X 3. Moves all extremities spontaneously. Psych: Normal affect. HEENT:  Normal  Neck: Supple without bruits or JVD. Lungs:  Resp regular and unlabored, diminished breath sounds bilat. Heart: RRR - distant.  No s3, s4, or murmurs. Abdomen: Soft, non-tender, non-distended, BS + x 4.  Extremities: No clubbing, cyanosis or edema. DP/PT/Radials 2+ and equal bilaterally.  Accessory Clinical Findings  CBC  Recent Labs  08/03/14 1334 08/03/14 1350  WBC 8.2  --   NEUTROABS 5.1  --   HGB 14.3 15.3  HCT 42.0 45.0  MCV 92.7  --   PLT 194  --    Basic Metabolic Panel  Recent Labs  08/03/14 1350 08/04/14 0501  NA 140 142  K 4.8 4.4  CL 105 106  CO2  --  26  GLUCOSE 117* 125*  BUN 13 12  CREATININE 1.10 1.02  CALCIUM  --  9.2   Cardiac Enzymes  Recent Labs  08/03/14 1809  08/03/14 2310 08/04/14 0501  TROPONINI <0.30 <0.30 <0.30   Fasting Lipid Panel  Recent Labs  08/04/14 0501  CHOL 200  HDL 26*  LDLCALC 143*  TRIG 154*  CHOLHDL 7.7   Thyroid Function Tests  Recent Labs  08/03/14 2310  TSH 3.620    TELE  Seen in nuc med.  Radiology/Studies  Dg Chest 2 View  08/03/2014   CLINICAL DATA:  Sharp lateral chest pain, shortness of breath, chronic cough  EXAM: CHEST  2 VIEW  COMPARISON:  04/04/2014  FINDINGS: Chronic interstitial markings/ emphysematous changes. No focal consolidation. No pleural effusion or pneumothorax.  The heart is normal in size.  Degenerative changes of the visualized thoracolumbar spine.  IMPRESSION: No evidence of acute cardiopulmonary disease.   Electronically Signed   By: Julian Hy M.D.   On: 08/03/2014 14:18   ASSESSMENT AND PLAN  1.  Midsternal Chest Pain/CAD:  No further chest pain.  CE neg.  For MV today.  Cont plavix, bb, statin.  Pt is not on ASA per his preference.  If MV neg-> plan d/c.  2.  HTN:  BP elevated.  Only on BB.  F/U after AM meds.  Likely needs additional agent.  3.  HL:  Cont statin.u  LD 143.  Simva started yesterday.  Signed, [name] Hodgkins NP  I have personally seen and examined this patient with [name] Bayley, NP. I agree with the assessment and plan as outlined above. Chest pain has resolved. No objective evidence of ischemia. Cardiac markers negative. Will add Norvasc for better BP control. Will follow results of stress test later today. If no ischemia, will discharge home. If he does have evidence of ischemia, will need cardiac cath prior to discharge.   [name] 08/04/2014 12:26 PM

## 2014-09-10 ENCOUNTER — Ambulatory Visit: Payer: Medicare HMO | Admitting: Pulmonary Disease

## 2014-09-18 ENCOUNTER — Encounter (HOSPITAL_COMMUNITY): Payer: Self-pay | Admitting: Cardiovascular Disease

## 2014-10-16 ENCOUNTER — Ambulatory Visit: Payer: Medicare HMO | Admitting: Pulmonary Disease

## 2014-11-12 ENCOUNTER — Ambulatory Visit: Payer: Medicare HMO | Admitting: Pulmonary Disease

## 2015-02-08 DEATH — deceased

## 2015-05-07 ENCOUNTER — Encounter: Payer: Self-pay | Admitting: *Deleted

## 2015-05-29 ENCOUNTER — Encounter: Payer: Self-pay | Admitting: Cardiology

## 2015-05-29 ENCOUNTER — Encounter: Payer: Self-pay | Admitting: Cardiovascular Disease
